# Patient Record
Sex: Female | Born: 1975
Health system: Southern US, Community
[De-identification: ages and names within clinical notes are randomized; demographics above are authoritative.]

## PROBLEM LIST (undated history)

## (undated) DIAGNOSIS — C801 Malignant (primary) neoplasm, unspecified: Secondary | ICD-10-CM

## (undated) DIAGNOSIS — D649 Anemia, unspecified: Secondary | ICD-10-CM

## (undated) DIAGNOSIS — J302 Other seasonal allergic rhinitis: Secondary | ICD-10-CM

## (undated) HISTORY — PX: NO PAST SURGERIES: SHX2092

## (undated) HISTORY — DX: Other seasonal allergic rhinitis: J30.2

---

## 2018-05-15 DIAGNOSIS — Z01419 Encounter for gynecological examination (general) (routine) without abnormal findings: Secondary | ICD-10-CM | POA: Diagnosis not present

## 2018-05-15 DIAGNOSIS — R350 Frequency of micturition: Secondary | ICD-10-CM | POA: Diagnosis not present

## 2018-05-15 DIAGNOSIS — N898 Other specified noninflammatory disorders of vagina: Secondary | ICD-10-CM | POA: Diagnosis not present

## 2018-06-10 ENCOUNTER — Ambulatory Visit: Payer: Self-pay | Admitting: Family Medicine

## 2018-07-11 NOTE — Progress Notes (Signed)
Kelly Moreno DOB: Apr 12, 1976 Encounter date: 07/13/2018  This is a 42 y.o. female who presents to establish care. Chief Complaint  Patient presents with  . New Patient (Initial Visit)    History of present illness:  Fiance broke off engagement Aug 21st. Since that day, things have just been getting worse. Had appointment but came on wrong day; so had to reschedule until now. Everything has been very difficult. She was completely surprised by the break up. Fiance and her have been together for 9 years and had all of wedding details planned out (they were about to send invites). Unfortunately fiance does not seem like he wants to make things work at this point. Has not been willing to go to counseling with her. She feels like her world has been turned upside down as she was ready to start a family and worries about timeline for having children.   Good family support, but feels that they don't understand all that she is going through.  She started seeing therapist and they recommended she come see about getting on medication. She is hesitant to take medication and does not wish to take something at this point that she would need every day, but admits that she feels like she might need something to help her through this. Has had benefit from therapy, but not sure she can maintain cost of sessions as she is limited through her insurance.   Not sleeping. Not had issues with anxiety/depression in past. Maybe sleeping 4-5 hours for now. Has to take something for sleep. Tried aleve PM for sleep; now taking melatonin. Still only getting 5 hours sleep with this. Not feeling rested when she gets up.   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 Has missed 7-8 days since all of this happened. 1.5 hour commute to work. Works M-F. Leaves at [employer] in morning and gets home after 6. Has days where she feels she shouldn't be out on road; just lack of sleep, or days unable to stop crying or get  control of symptoms. At least 2x/month would be ok with her. Difficult to make it through day without crying. Coworkers do not yet know about engagement being broken off and she feels she just can't answer questions at this point.   Past Medical History:  Diagnosis Date  . Seasonal allergies    History reviewed. No pertinent surgical history. No Known Allergies Current Meds  Medication Sig  . levocetirizine (XYZAL) 5 MG tablet Take 5 mg by mouth every evening.  . montelukast (SINGULAIR) 10 MG tablet Take 10 mg by mouth at bedtime.  . norgestimate-ethinyl estradiol (SPRINTEC 28) 0.25-35 MG-MCG tablet Take 1 tablet by mouth daily.   Social History   Tobacco Use  . Smoking status: Never Smoker  . Smokeless tobacco: Never Used  Substance Use Topics  . Alcohol use: Not on file   Family History  Problem Relation Age of Onset  . High blood pressure Mother   . High Cholesterol Mother   . High blood pressure Father   . High Cholesterol Father   . Osteoarthritis Maternal Grandmother   . Cancer Maternal Grandmother        blood; ended up turning to leukemia  . Alcohol abuse Maternal Grandfather   . Heart disease Maternal Grandfather   . Osteoarthritis Paternal Grandmother   . Kidney failure Paternal Grandmother   . Lung cancer Paternal Grandfather        smoker  Review of Systems  Constitutional: Negative for chills, fatigue and fever.  Respiratory: Negative for cough, chest tightness, shortness of breath and wheezing.   Cardiovascular: Negative for chest pain, palpitations and leg swelling.  Psychiatric/Behavioral: Positive for decreased concentration and sleep disturbance. Negative for agitation, self-injury and suicidal ideas. The patient is not nervous/anxious.     Objective:  BP 120/60 (BP Location: Left Arm, Patient Position: Sitting, Cuff Size: Normal)   Pulse 93   Temp 98.5 F (36.9 C) (Oral)   Ht 5\' 2"  (1.575 m)   Wt 184 lb 9.6 oz (83.7 kg)   SpO2 99%   BMI  33.76 kg/m   Weight: 184 lb 9.6 oz (83.7 kg)   BP Readings from Last 3 Encounters:  07/13/18 120/60   Wt Readings from Last 3 Encounters:  07/13/18 184 lb 9.6 oz (83.7 kg)    Physical Exam  Constitutional: She is oriented to person, place, and time. She appears well-developed and well-nourished. No distress.  Cardiovascular: Normal rate, regular rhythm and normal heart sounds. Exam reveals no friction rub.  No murmur heard. No lower extremity edema  Pulmonary/Chest: Effort normal and breath sounds normal. No respiratory distress. She has no wheezes. She has no rales.  Neurological: She is alert and oriented to person, place, and time.  Psychiatric: Her behavior is normal. Her affect is blunt. Her speech is not rapid and/or pressured. Cognition and memory are normal. She exhibits a depressed mood.  She is tearful during discussion. Just feels like whole life has been turned upside down.    Assessment/Plan:  1. Depression, major, single episode, moderate ([place]) This is very situational, but as we discussed sometimes medication can aid with management. We discussed SSRIs and wellbutrin but at this point she would prefer not to take something on a daily basis. I have encouraged her to let me know if she changes her mind, but in the meanwhile we will focus on sleep. I stressed exercise and re-establishing program, but she feels this will be too difficult with current work hours/commute.  2. Anxiety She wishes to avoid any benzodiazepines (on advice of friend who is counselor) but we discussed vistaril to help with sleep and (if not too sedating) to help with daily anxiety/stress. We discussed starting with low dose so that she may be able to use during work day. I do recommend continuing with therapy. It would of course be ideal if significant other would also participate.   3. Insomnia, unspecified type Trial of vistaril. Could also consider trazodone. She will update me on how sleep is  going in a week; if she feels this is not enough to help with mood management we could consider SSRI like zoloft that may help with all of above.   Return in about 1 month (around 08/12/2018). She agrees to let me know sooner if any worsening of mood.   Micheline Rough, MD

## 2018-07-13 ENCOUNTER — Ambulatory Visit: Payer: 59 | Admitting: Family Medicine

## 2018-07-13 ENCOUNTER — Encounter: Payer: Self-pay | Admitting: Family Medicine

## 2018-07-13 VITALS — BP 120/60 | HR 93 | Temp 98.5°F | Ht 62.0 in | Wt 184.6 lb

## 2018-07-13 DIAGNOSIS — G4700 Insomnia, unspecified: Secondary | ICD-10-CM | POA: Diagnosis not present

## 2018-07-13 DIAGNOSIS — Z23 Encounter for immunization: Secondary | ICD-10-CM | POA: Diagnosis not present

## 2018-07-13 DIAGNOSIS — F321 Major depressive disorder, single episode, moderate: Secondary | ICD-10-CM | POA: Diagnosis not present

## 2018-07-13 DIAGNOSIS — F419 Anxiety disorder, unspecified: Secondary | ICD-10-CM | POA: Diagnosis not present

## 2018-07-13 MED ORDER — HYDROXYZINE HCL 10 MG PO TABS: 10.0000 mg | ORAL_TABLET | Freq: Three times a day (TID) | ORAL | 1 refills | Status: DC | PRN

## 2018-07-13 NOTE — Patient Instructions (Addendum)
Try to get back to exercise program. I really feel that this will help your mood.   OK for trial of hydoxyzine for anxiety. Please see how you do with this in evening (ok to take two tablets to help with sleep) and if needed can try 1/2-1 tab during day for anxiety. OK to combine with melatonin.   Give me an update in 1 week with how sleeping is doing and how anxiety is doing. Please let me know sooner if any worsening of symptoms or not tolerating medication.

## 2018-07-27 ENCOUNTER — Telehealth: Payer: Self-pay

## 2018-07-27 NOTE — Telephone Encounter (Signed)
FMLA paper work has been completed for patient. Patient did not sign the forms before giving it to [place]. She will need to come sign the forms before we can fax it.   Forms placed up front for patient to come in and sign.

## 2018-07-27 NOTE — Telephone Encounter (Signed)
CRM created. 

## 2018-07-28 NOTE — Telephone Encounter (Signed)
Spoke with patient she has asked that the form be mailed to her

## 2018-11-04 DIAGNOSIS — L409 Psoriasis, unspecified: Secondary | ICD-10-CM | POA: Diagnosis not present

## 2018-11-04 DIAGNOSIS — Z1231 Encounter for screening mammogram for malignant neoplasm of breast: Secondary | ICD-10-CM | POA: Diagnosis not present

## 2018-12-01 ENCOUNTER — Telehealth: Payer: Self-pay

## 2018-12-01 NOTE — Telephone Encounter (Signed)
Last OV was 07/13/18. She is due for a follow up on her anxiety. Spoke with patient. She has declined a virtual visit.

## 2020-09-02 HISTORY — PX: ABLATION: SHX5711

## 2021-06-05 ENCOUNTER — Other Ambulatory Visit: Payer: Self-pay | Admitting: Urology

## 2021-06-05 DIAGNOSIS — D4100 Neoplasm of uncertain behavior of unspecified kidney: Secondary | ICD-10-CM

## 2021-06-19 ENCOUNTER — Other Ambulatory Visit: Payer: Self-pay

## 2021-06-19 ENCOUNTER — Ambulatory Visit
Admission: RE | Admit: 2021-06-19 | Discharge: 2021-06-19 | Disposition: A | Discharge status: Home / Self Care | Payer: PRIVATE HEALTH INSURANCE | Source: Ambulatory Visit | Attending: Urology | Admitting: Urology

## 2021-06-19 DIAGNOSIS — D4100 Neoplasm of uncertain behavior of unspecified kidney: Secondary | ICD-10-CM

## 2021-06-19 IMAGING — MR MR ABDOMEN WO/W CM
11 of 17 series · 28 of 48 positions shown · IV contrast (multihance)
Comparison: CT abdomen pelvis, [DATE]

CLINICAL DATA: Characterize right renal lesion

EXAM:
MRI ABDOMEN WITHOUT AND WITH CONTRAST
TECHNIQUE: Multiplanar multisequence MR imaging of the abdomen was performed
both before and after the administration of intravenous contrast.
CONTRAST:  17mL MULTIHANCE GADOBENATE DIMEGLUMINE 529 MG/ML IV SOLN

[Series 3: cor haste · coronal · 5.0mm · 0.68mm/px · 2 of 32 slices shown]
[im 1/32]
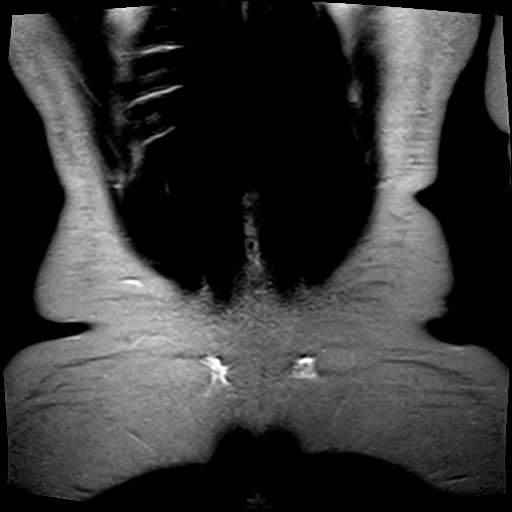
[im 32/32]
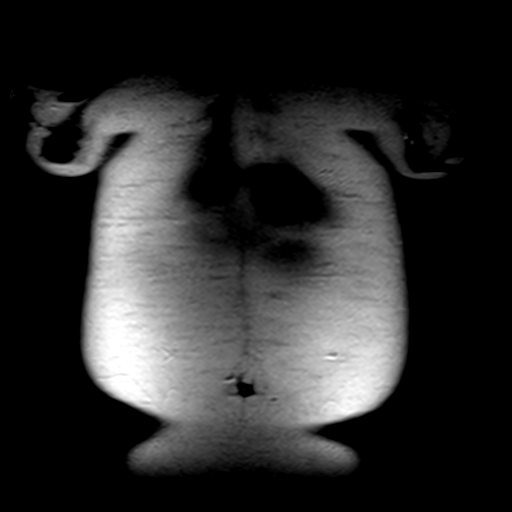

[Series 4: axial haste · axial · 6.0mm · 0.68mm/px · z∈[-101,+109]mm · 2 of 33 slices shown]
[im 1/33]
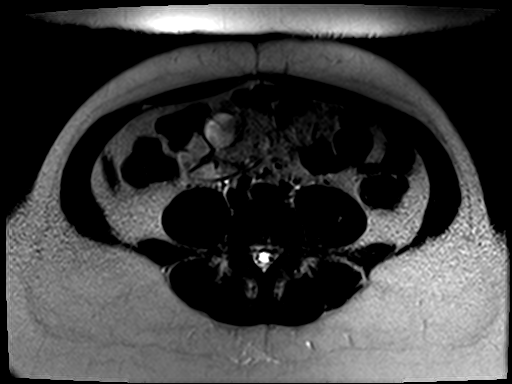
[im 33/33]
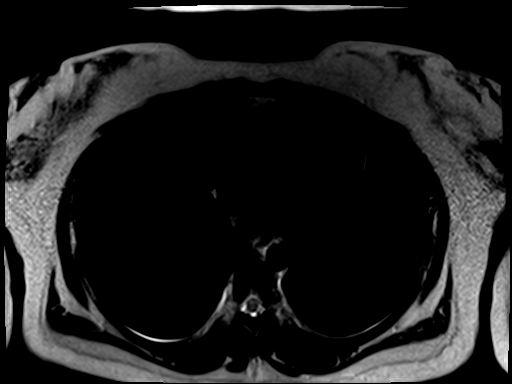

[Series 5: T1 · axial · 6.0mm · 0.68mm/px · z∈[-101,+109]mm · 4 of 66 slices shown]
[im 1/66]
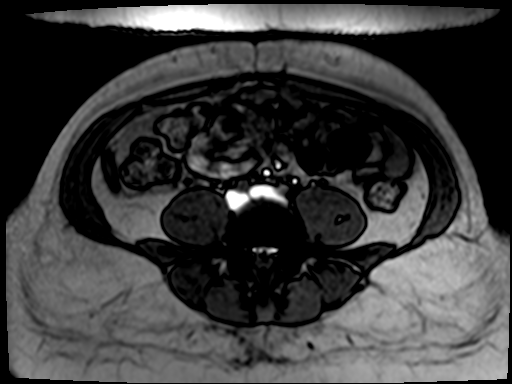
[im 22/66]
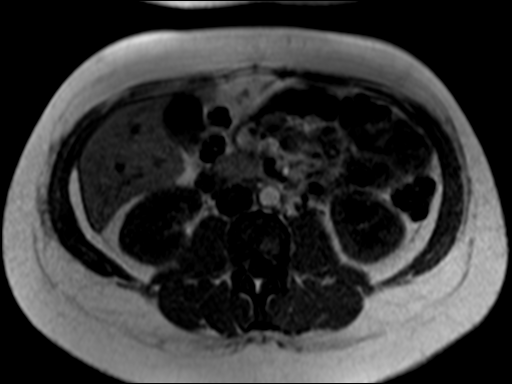
[im 44/66]
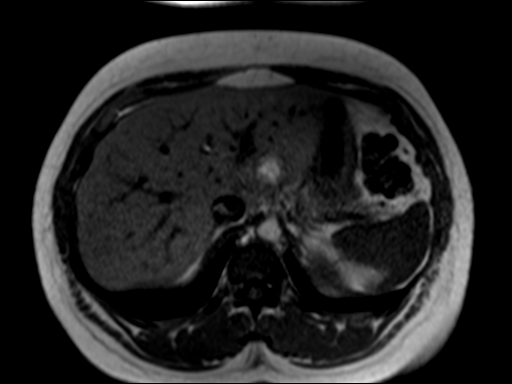
[im 66/66]
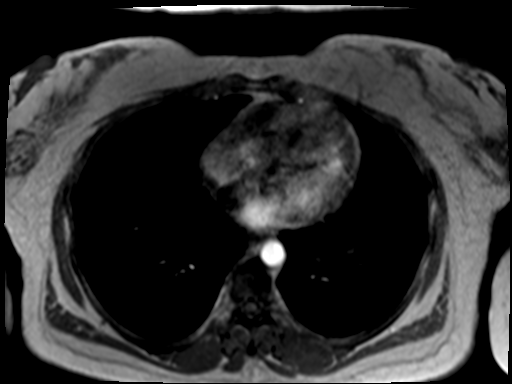

[Series 6: bSSFP · axial · 4.0mm · 0.68mm/px · z∈[-108,+116]mm · 3 of 57 slices shown]
[im 1/57]
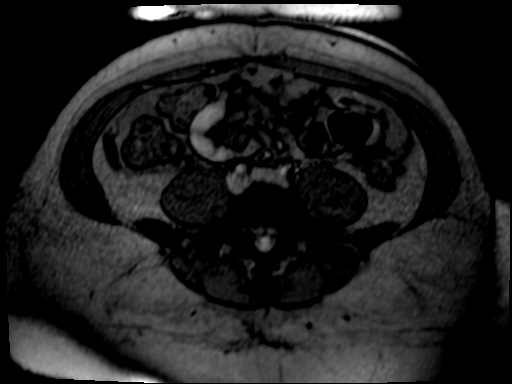
[im 29/57]
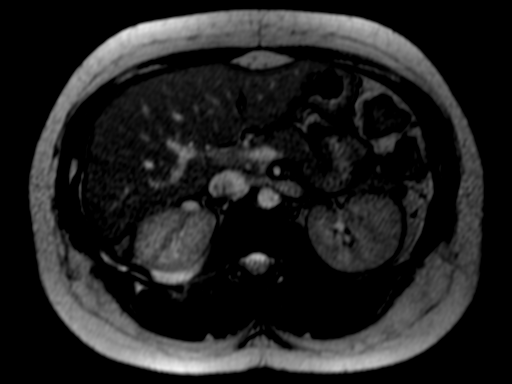
[im 57/57]
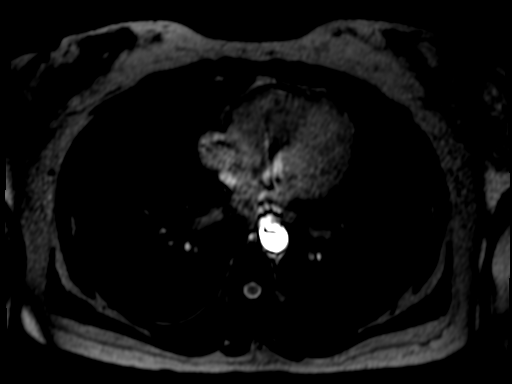

[Series 7: T2 fat-sat · axial · 6.0mm · 1.09mm/px · z∈[-74,+149]mm · 2 of 32 slices shown]
[im 1/32]
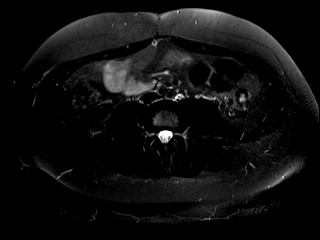
[im 32/32]
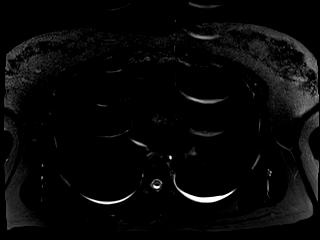

[Series 8: [id number]_trig · axial · 6.0mm · 1.82mm/px · z∈[-74,+149]mm · 4 of 96 slices shown]
[im 1/96]
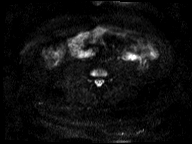
[im 32/96]
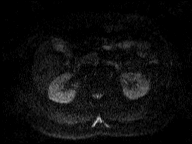
[im 64/96]
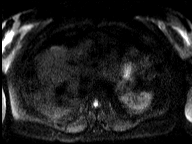
[im 96/96]
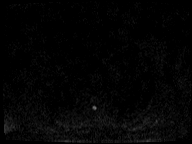

[Series 9: [id number]_trig_adc · axial · 6.0mm · 1.82mm/px · 1 of 32 slices shown]
[im 1/32]
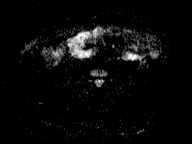

[Series 10: T1 dynamic · axial · non-contrast · 2.5mm · 0.74mm/px · z∈[-100,+96]mm · 3 of 80 slices shown]
[im 1/80]
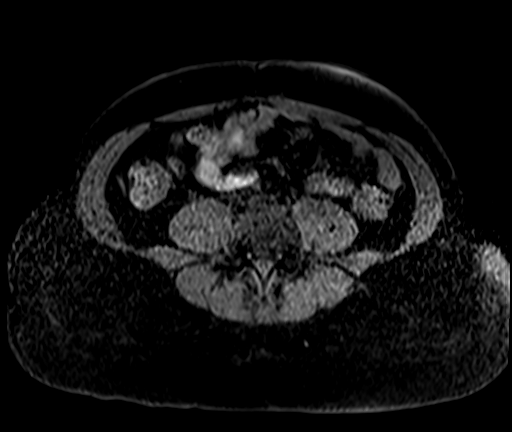
[im 40/80]
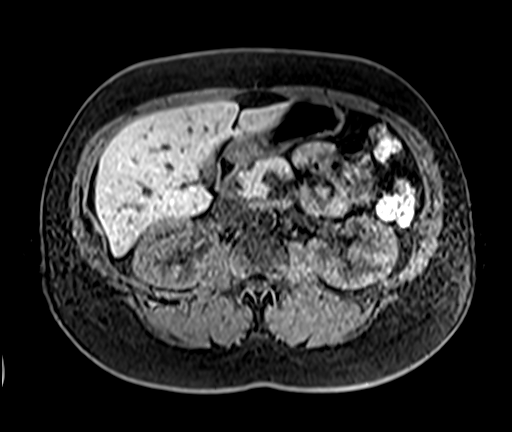
[im 80/80]
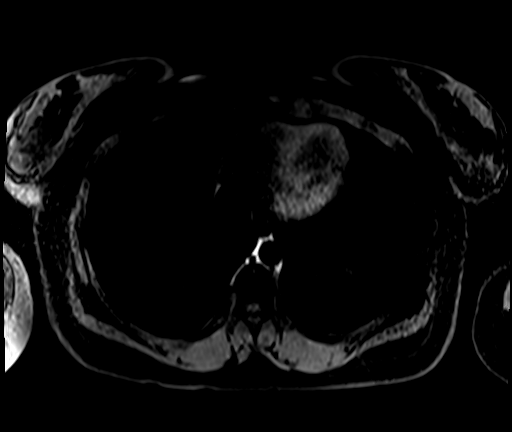

[Series 11: T1 dynamic post-contrast · axial · 2.5mm · 0.74mm/px · z∈[-100,+96]mm · 3 of 80 slices shown (1 of 3)]
[im 1/80]
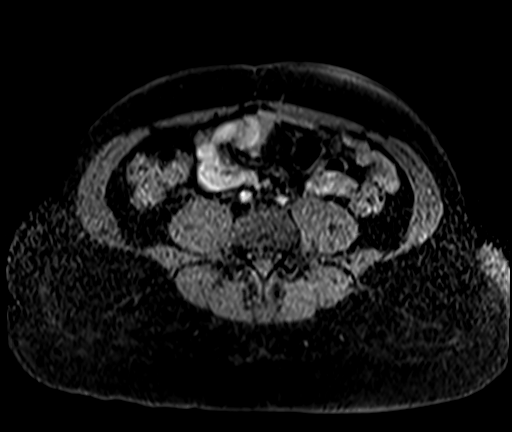
[im 40/80]
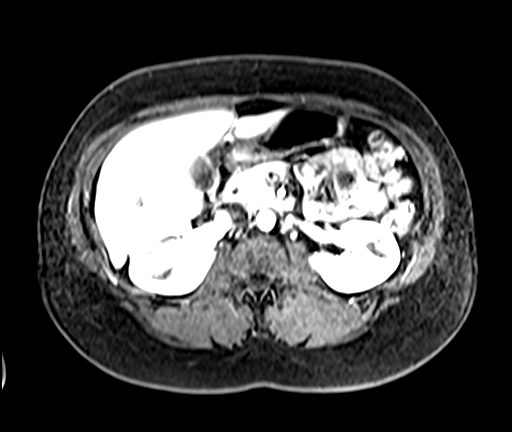
[im 80/80]
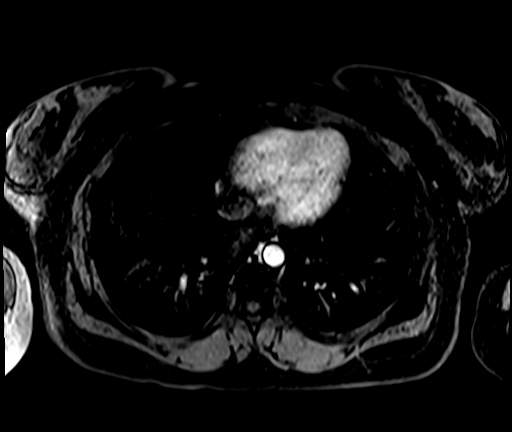

[Series 12: T1 dynamic post-contrast · axial · 2.5mm · 0.74mm/px · z∈[-100,+96]mm · 3 of 80 slices shown (2 of 3)]
[im 1/80]
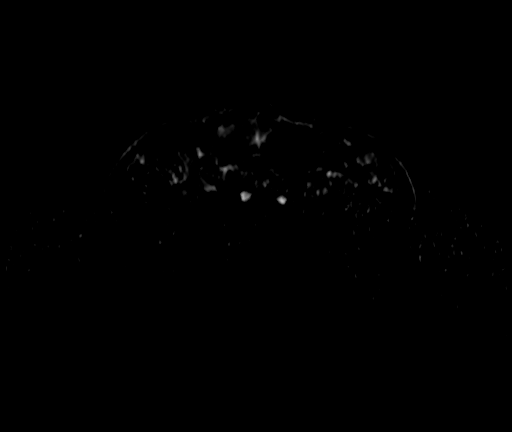
[im 40/80]
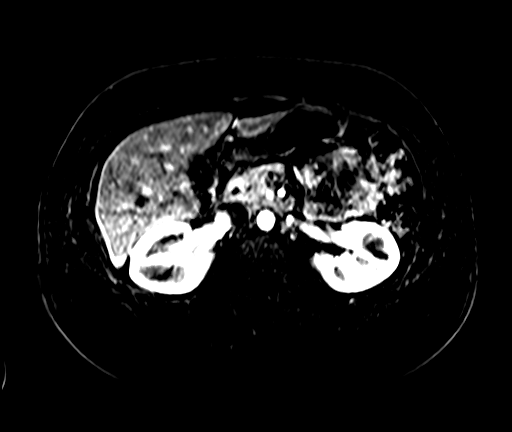
[im 80/80]
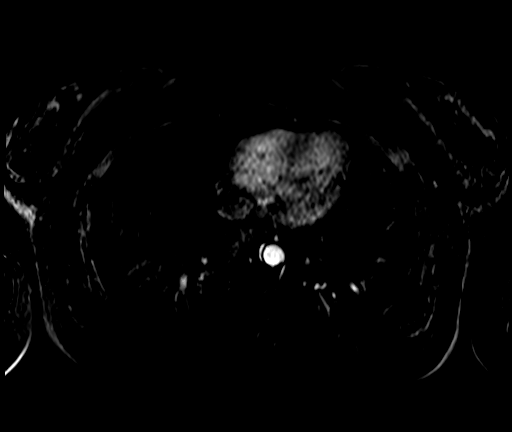

[Series 13: T1 dynamic post-contrast · axial · 2.5mm · 0.74mm/px · 1 of 80 slices shown (3 of 3)]
[im 1/80]
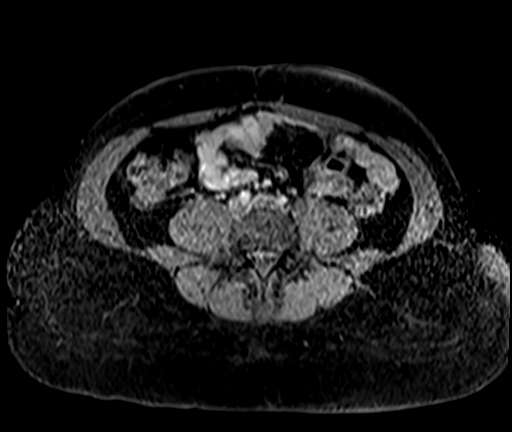

[28 of 48 positions shown; findings below may reference images not displayed]

FINDINGS: Lower chest: No acute findings.

Hepatobiliary: No mass or other parenchymal abnormality identified.
No gallstones. No biliary ductal dilatation.

Pancreas: No mass, inflammatory changes, or other parenchymal
abnormality identified. Pancreatic ductal dilatation.

Spleen:  Within normal limits in size and appearance.

Adrenals/Urinary Tract: Normal adrenals. Partially exophytic mass of
the lateral midportion of the right kidney measures 1.3 x 1.1 cm and
demonstrates contrast enhancement (series 13, image 40). Additional
simple cyst of the inferior pole of the right kidney. No evidence of
hydronephrosis.

Stomach/Bowel: Visualized portions within the abdomen are
unremarkable.

Vascular/Lymphatic: No pathologically enlarged lymph nodes
identified. No abdominal aortic aneurysm demonstrated.

Other:  None.

Musculoskeletal: No suspicious bone lesions identified.
IMPRESSION: 1. Partially exophytic mass of the lateral midportion of the right
kidney measures 1.3 x 1.1 cm and demonstrates contrast enhancement.
This is concerning for a small renal cell carcinoma.
2. No evidence of renal vein invasion, lymphadenopathy, or
metastatic disease in the abdomen.

## 2021-06-19 MED ORDER — GADOBENATE DIMEGLUMINE 529 MG/ML IV SOLN
17.0000 mL | Freq: Once | INTRAVENOUS | Status: AC | PRN
  Administered 2021-06-19: 17 mL via INTRAVENOUS

## 2021-06-22 ENCOUNTER — Ambulatory Visit (HOSPITAL_COMMUNITY)
Admission: RE | Admit: 2021-06-22 | Discharge: 2021-06-22 | Disposition: A | Discharge status: Home / Self Care | Payer: PRIVATE HEALTH INSURANCE | Source: Ambulatory Visit | Attending: Urology | Admitting: Urology

## 2021-06-22 ENCOUNTER — Other Ambulatory Visit (HOSPITAL_COMMUNITY): Payer: Self-pay | Admitting: Urology

## 2021-06-22 ENCOUNTER — Other Ambulatory Visit: Payer: Self-pay

## 2021-06-22 DIAGNOSIS — D49511 Neoplasm of unspecified behavior of right kidney: Secondary | ICD-10-CM | POA: Insufficient documentation

## 2021-06-22 DIAGNOSIS — I82220 Acute embolism and thrombosis of inferior vena cava: Secondary | ICD-10-CM | POA: Insufficient documentation

## 2021-06-22 IMAGING — DX DG CHEST 2V
2 series · 2 of 2 positions shown · non-contrast
Comparison: CT abdomen pelvis [DATE]

CLINICAL DATA: hx prev kidney cancer

EXAM:
CHEST - 2 VIEW

[chest pa]
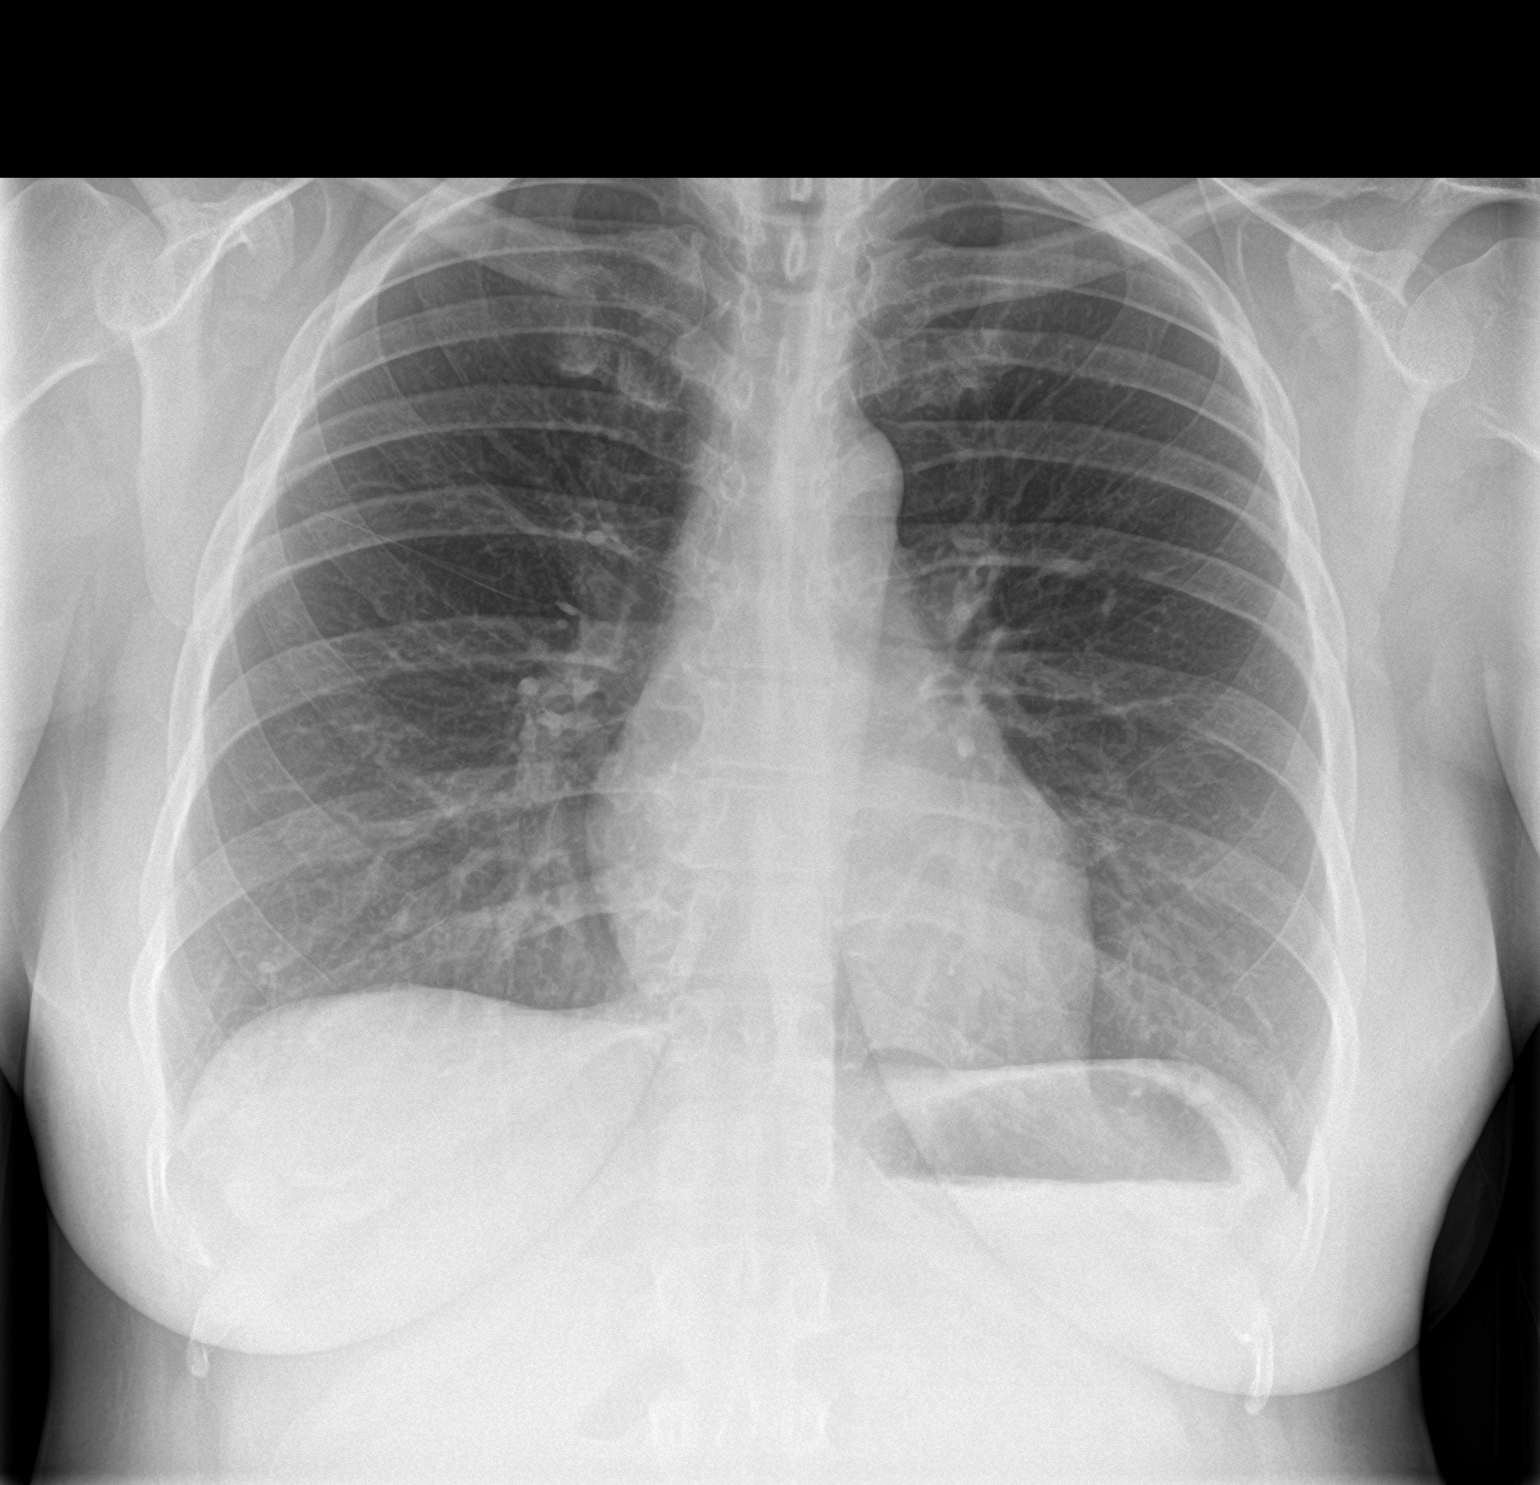

[chest lat]
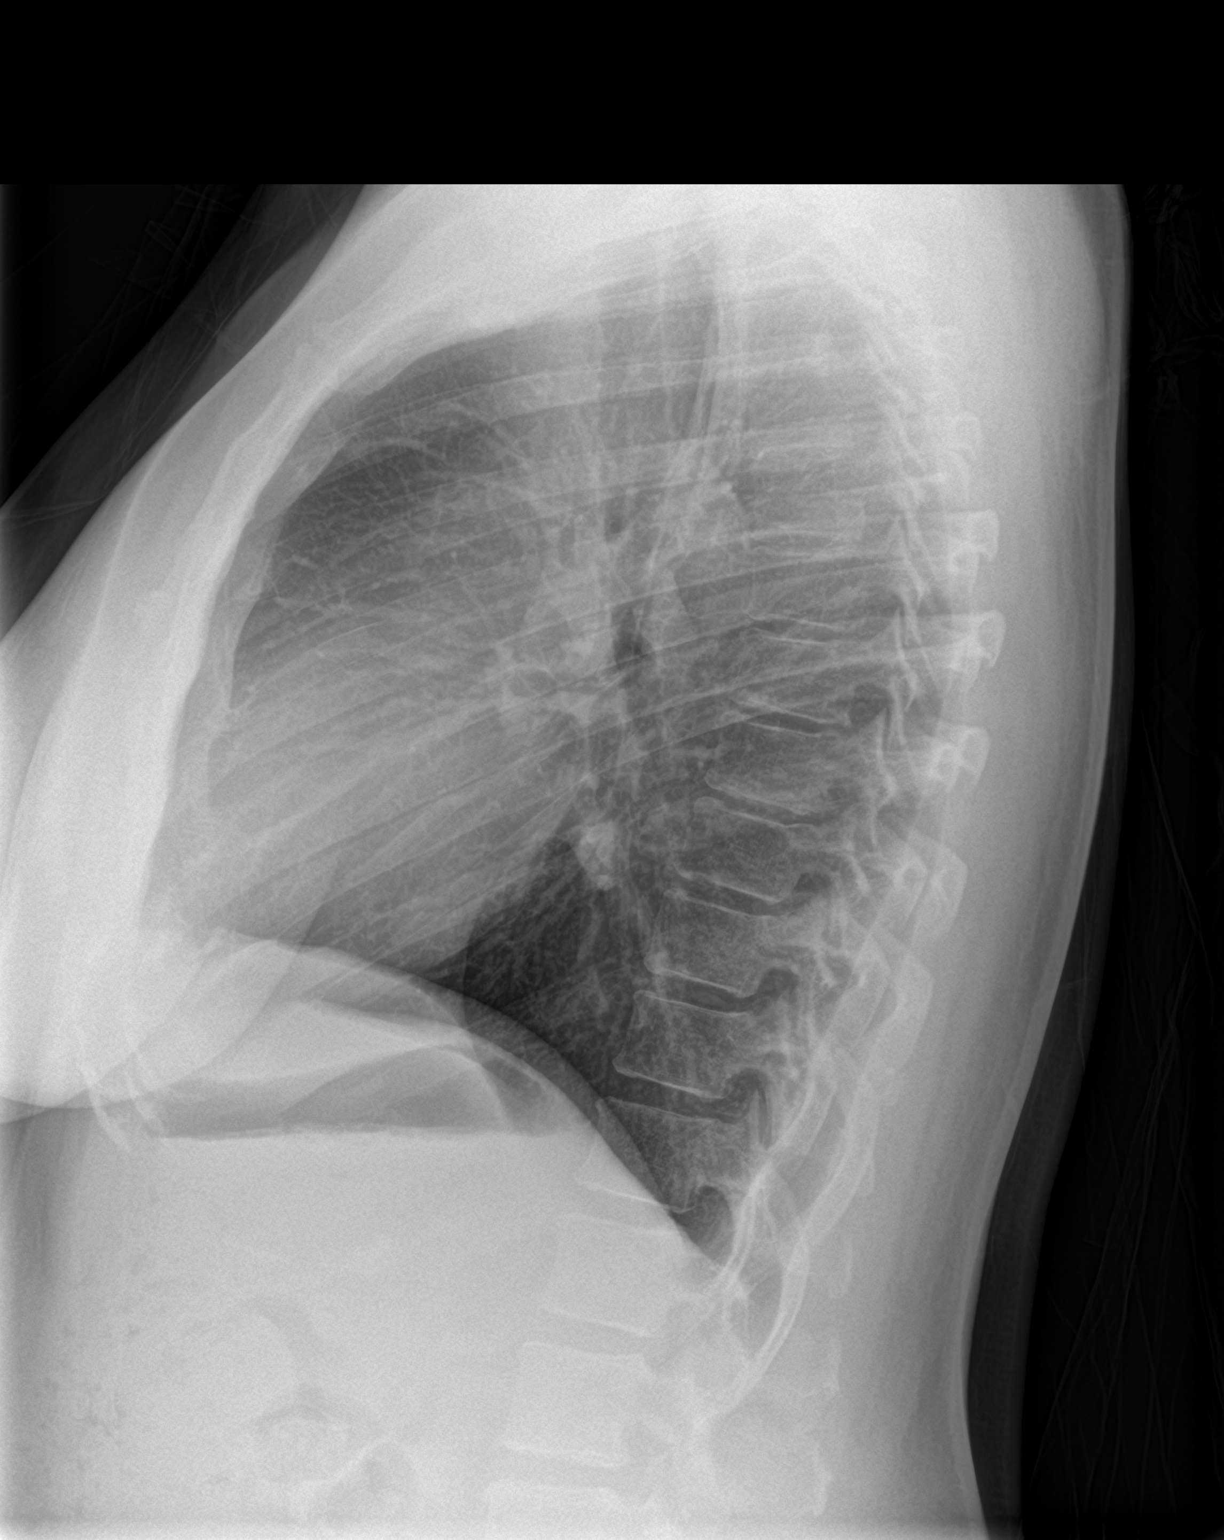

[2 of 2 positions shown; findings below may reference images not displayed]

FINDINGS: The cardiomediastinal silhouette is within normal limits. No pleural
effusion. No pneumothorax. No mass or consolidation. No acute
osseous abnormality.
IMPRESSION: No acute process identified in the chest.

Note that CT chest is more sensitive for the detection of early
metastatic disease to the lungs.

## 2021-07-09 ENCOUNTER — Other Ambulatory Visit: Payer: Self-pay | Admitting: Urology

## 2021-07-11 ENCOUNTER — Other Ambulatory Visit: Payer: Self-pay | Admitting: Urology

## 2021-07-11 DIAGNOSIS — N2889 Other specified disorders of kidney and ureter: Secondary | ICD-10-CM

## 2021-07-19 ENCOUNTER — Other Ambulatory Visit: Payer: Self-pay

## 2021-07-19 ENCOUNTER — Ambulatory Visit
Admission: RE | Admit: 2021-07-19 | Discharge: 2021-07-19 | Disposition: A | Discharge status: Home / Self Care | Payer: PRIVATE HEALTH INSURANCE | Source: Ambulatory Visit | Attending: Urology | Admitting: Urology

## 2021-07-19 ENCOUNTER — Encounter: Payer: Self-pay | Admitting: *Deleted

## 2021-07-19 DIAGNOSIS — N2889 Other specified disorders of kidney and ureter: Secondary | ICD-10-CM

## 2021-07-19 HISTORY — PX: IR RADIOLOGIST EVAL & MGMT: IMG5224

## 2021-07-19 NOTE — Consult Note (Signed)
Chief Complaint: Indeterminate right sided renal lesion  Referring Physician(s): Gay,Matthew R  History of Present Illness: Kelly Moreno is a 45 y.o. female with past medical history significant for seasonal allergies who is has been referred for evaluation of potential percutaneous management of worrisome right-sided renal lesion identified on abdominal CT performed 05/31/2021 for the evaluation of microscopic hematuria.  The lesion was further evaluated on abdominal MRI performed 06/19/2021 demonstrated an approximately 1.3 x 1.1 cm enhancing lesion involving the mid lateral aspect of the right kidney with imaging characteristics worrisome for a renal cell carcinoma.  Patient is asymptomatic in regards to this incidentally discovered right-sided renal lesion.  Specifically, she denies flank pain, objective hematuria, unintentional weight loss or weight gain.  The patient is interested in avoiding a surgical intervention if possible.    Past Medical History:  Diagnosis Date   Seasonal allergies     No past surgical history on file.  Allergies: Patient has no known allergies.  Medications: Prior to Admission medications   Medication Sig Start Date End Date Taking? Authorizing Provider  hydrOXYzine (ATARAX/VISTARIL) 10 MG tablet Take 1-2 tablets (10-20 mg total) by mouth 3 (three) times daily as needed for anxiety. 07/13/18   Koberlein, Steele Berg, MD  levocetirizine (XYZAL) 5 MG tablet Take 5 mg by mouth every evening.    [provider]  montelukast (SINGULAIR) 10 MG tablet Take 10 mg by mouth at bedtime.    [provider]  norgestimate-ethinyl estradiol ([place] 28) 0.25-35 MG-MCG tablet Take 1 tablet by mouth daily.    [provider]     Family History  Problem Relation Age of Onset   High blood pressure Mother    High Cholesterol Mother    High blood pressure Father    High Cholesterol Father    Osteoarthritis Maternal Grandmother     Cancer Maternal Grandmother        blood; ended up turning to leukemia   Alcohol abuse Maternal Grandfather    Heart disease Maternal Grandfather    Osteoarthritis Paternal Grandmother    Kidney failure Paternal Grandmother    Lung cancer Paternal Grandfather        smoker    Social History   Socioeconomic History   Marital status: Single    Spouse name: Not on file   Number of children: Not on file   Years of education: Not on file   Highest education level: Not on file  Occupational History   Not on file  Tobacco Use   Smoking status: Never   Smokeless tobacco: Never  Substance and Sexual Activity   Alcohol use: Not on file   Drug use: Not on file   Sexual activity: Not on file  Other Topics Concern   Not on file  Social History Narrative   Not on file   Social Determinants of Health   Financial Resource Strain: Not on file  Food Insecurity: Not on file  Transportation Needs: Not on file  Physical Activity: Not on file  Stress: Not on file  Social Connections: Not on file    ECOG Status: 0 - Asymptomatic  Review of Systems  Review of Systems: A 12 point ROS discussed and pertinent positives are indicated in the HPI above.  All other systems are negative.  Physical Exam No direct physical exam was performed (except for noted visual exam findings with Video Visits).   Vital Signs: There were no vitals taken for this visit.  Imaging:  Personal review of CT scan of the abdomen pelvis performed 05/31/2021 as well as abdominal MRI performed 06/19/2021 demonstrates an enhancing approximately 1.3 x 1.1 cm lesion arising from the anterior lateral interpolar aspect of the right kidney.  The lesion appears to abut the adjacent posterior medial aspect of the liver without definitive direct invasion.  The right renal vein appears widely patent without involvement.  The lesion does not appear to abut the right renal collecting system and thus is unlikely to serve as the  etiology of the patient's microscopic hematuria.  No regional lymphadenopathy.  DG Chest 2 View  Result Date: 06/22/2021 CLINICAL DATA:  hx prev kidney cancer EXAM: CHEST - 2 VIEW COMPARISON:  CT abdomen pelvis September 2022 FINDINGS: The cardiomediastinal silhouette is within normal limits. No pleural effusion. No pneumothorax. No mass or consolidation. No acute osseous abnormality. IMPRESSION: No acute process identified in the chest. Note that CT chest is more sensitive for the detection of early metastatic disease to the lungs. Electronically Signed   By: Albin Felling M.D.   On: 06/22/2021 16:12   MR ABDOMEN WWO CONTRAST  Result Date: 06/19/2021 CLINICAL DATA:  Characterize right renal lesion EXAM: MRI ABDOMEN WITHOUT AND WITH CONTRAST TECHNIQUE: Multiplanar multisequence MR imaging of the abdomen was performed both before and after the administration of intravenous contrast. CONTRAST:  11mL MULTIHANCE GADOBENATE DIMEGLUMINE 529 MG/ML IV SOLN COMPARISON:  CT abdomen pelvis, 05/31/2021 FINDINGS: Lower chest: No acute findings. Hepatobiliary: No mass or other parenchymal abnormality identified. No gallstones. No biliary ductal dilatation. Pancreas: No mass, inflammatory changes, or other parenchymal abnormality identified. Pancreatic ductal dilatation. Spleen:  Within normal limits in size and appearance. Adrenals/Urinary Tract: Normal adrenals. Partially exophytic mass of the lateral midportion of the right kidney measures 1.3 x 1.1 cm and demonstrates contrast enhancement (series 13, image 40). Additional simple cyst of the inferior pole of the right kidney. No evidence of hydronephrosis. Stomach/Bowel: Visualized portions within the abdomen are unremarkable. Vascular/Lymphatic: No pathologically enlarged lymph nodes identified. No abdominal aortic aneurysm demonstrated. Other:  None. Musculoskeletal: No suspicious bone lesions identified. IMPRESSION: 1. Partially exophytic mass of the lateral  midportion of the right kidney measures 1.3 x 1.1 cm and demonstrates contrast enhancement. This is concerning for a small renal cell carcinoma. 2. No evidence of renal vein invasion, lymphadenopathy, or metastatic disease in the abdomen. Electronically Signed   By: Delanna Ahmadi M.D.   On: 06/19/2021 16:24    Labs:  CBC: No results for input(s): WBC, HGB, HCT, PLT in the last 8760 hours.  COAGS: No results for input(s): INR, APTT in the last 8760 hours.  BMP: No results for input(s): NA, K, CL, CO2, GLUCOSE, BUN, CALCIUM, CREATININE, GFRNONAA, GFRAA in the last 8760 hours.  Invalid input(s): CMP  LIVER FUNCTION TESTS: No results for input(s): BILITOT, AST, ALT, ALKPHOS, PROT, ALBUMIN in the last 8760 hours.  TUMOR MARKERS: No results for input(s): AFPTM, CEA, CA199, CHROMGRNA in the last 8760 hours.  Assessment and Plan:  Kelly Moreno is a 45 y.o. female with past medical history significant for seasonal allergies who is has been referred for evaluation of potential percutaneous management of worrisome right-sided renal lesion identified on abdominal CT performed 05/31/2021 for the evaluation of microscopic hematuria.  The lesion was further evaluated on abdominal MRI performed 06/19/2021 demonstrated an approximately 1.3 x 1.1 cm enhancing lesion involving the mid lateral aspect of the right kidney with imaging characteristics worrisome for a renal cell carcinoma.  Patient  is asymptomatic in regards to this incidentally discovered right-sided renal lesion.    Personal review of CT scan of the abdomen and pelvis performed 05/31/2021 as well as abdominal MRI performed 06/19/2021 demonstrates an enhancing approximately 1.3 x 1.1 cm lesion arising from the anterior lateral interpolar aspect of the right kidney.  The lesion appears to abut the adjacent posterior medial aspect of the liver without definitive direct invasion.  The right renal vein appears widely patent without involvement.   The lesion does not appear to abut the right renal collecting system and thus is unlikely to serve as the etiology of the patient's microscopic hematuria.  No regional lymphadenopathy.  I explained to the patient that given the imaging appearance of the lesion, it is considered a renal cell carcinoma and does not warrant biopsy as any result besides a kidney cancer would be deemed a sampling error.  Given the size (less than 3 cm) and location, exophytically located about the anterolateral aspect of the right kidney, I feel this solitary lesion is amenable to percutaneous cryoablation.  As such, prolonged conversations were held with the patient regarding potential management solutions including the following:  1.  Conservative management - I explained that in the absence of prior examinations, the growth rate of this lesion cannot be assessed however typically these instantly discovered renal lesions demonstrate an indolent behavior and 1 potential management solution could entail surveillance imaging.  If this option were pursued I would offer the patient a follow-up abdominal MRI in 3 months (mid January 2023).  2.  Definitive surgical resection, likely with partial nephrectomy - I explained that the gold standard treatment for renal lesions is surgical resection as a surgical margin/surgical cure is obtained at the time of the procedure.  3.  Cryoablation - I explained that while the cryoablation is performed with curative intent, as surgical margins are not obtained, we must ensure an imaging cure with postprocedural surveillance imaging obtained every 3 months the first year, every 6 months of following year with a final scan 3 years after the procedure. Risks and benefits of image guided renal cryoablation was discussed with the patient including, but not limited to, failure to treat entire lesion, bleeding, infection, damage to adjacent structures, hematuria, urine leak, decrease in renal  function or post procedural neuropathy. I explained that given the small size of the lesion, biopsy would likely not be obtained at the time of the cryoablation.  I explained that the cryoablation is performed at [facility] with general anesthesia and that the patient would be admitted overnight for continued observation and PCA usage.  I explained that the recovery from the procedure is typically in the range of 7 to 10 days at which time she could experience right-sided flank pain, post ablation syndrome (such as a low-grade fever and anorexia) as well as potentially macroscopic hematuria.  Following the above prolonged and detailed conversation, the patient wishes to pursue cryoablation and avoid a surgical intervention  As patient would like to have the procedure performed soon as possible, ideally during the weeks of either November 28th or December 5th, the patient is willing to have one of my other capable interventional radiology partners perform the procedure if this will entail the procedure is scheduled in a more expeditious manner.  Plan: - Schedule image guided right-sided renal cryoablation at [facility] at the earliest available ablation slot,  ideally during the weeks of either November 28th or December 5th.  Again, the patient is  going to have the procedure performed by one of my capable interventional radiology partners if this will result in a more expeditious procedure.  The patient knows to call the interventional radiology clinic with any interval questions or concerns  Thank you for this interesting consult.  I greatly enjoyed meeting Kelly Moreno and look forward to participating in their care.  A copy of this report was sent to the requesting provider on this date.  Electronically Signed: Sandi Mariscal 07/19/2021, 7:55 AM   I spent a total of 40 Minutes in remote  clinical consultation, greater than 50% of which was counseling/coordinating care for  indeterminate right-sided renal lesion.    Visit type: Audio only (telephone). Audio (no video) only due to patient's lack of internet/smartphone capability. Alternative for in-person consultation at [facility], [address], [place], [place]. This visit type was conducted due to national recommendations for restrictions regarding the COVID-19 Pandemic (e.g. social distancing).  This format is felt to be most appropriate for this patient at this time.  All issues noted in this document were discussed and addressed.

## 2021-07-30 ENCOUNTER — Other Ambulatory Visit (HOSPITAL_COMMUNITY): Payer: Self-pay | Admitting: Interventional Radiology

## 2021-07-30 DIAGNOSIS — N289 Disorder of kidney and ureter, unspecified: Secondary | ICD-10-CM

## 2021-07-30 NOTE — Progress Notes (Signed)
Orders requested via epic.

## 2021-08-07 NOTE — Patient Instructions (Signed)
DUE TO COVID-19 ONLY ONE VISITOR IS ALLOWED TO COME WITH YOU AND STAY IN THE WAITING ROOM ONLY DURING PRE OP AND PROCEDURE DAY OF SURGERY IF YOU ARE GOING HOME AFTER SURGERY. IF YOU ARE SPENDING THE NIGHT 2 PEOPLE MAY VISIT WITH YOU IN YOUR PRIVATE ROOM AFTER SURGERY UNTIL VISITING  HOURS ARE OVER AT 800 PM AND 1  VISITOR  MAY  SPEND THE NIGHT.   YOU NEED TO HAVE A COVID 19 TEST ON___12/12____ THIS TEST MUST BE DONE BEFORE SURGERY,  COVID TESTING SITE  IS LOCATED AT [place], [place]. REMAIN IN YOUR CAR THIS IS A DRIVE UP TEST. AFTER YOUR COVID TEST PLEASE WEAR A MASK OUT IN PUBLIC AND SOCIAL DISTANCE AND [place] YOUR HANDS FREQUENTLY, ALSO ASK ALL YOUR CLOSE CONTACT PERSONS TO WEAR A MASK AND SOCIAL DISTANCE AND [place] THEIR HANDS FREQUENTLY ALSO.               [name]     Your procedure is scheduled on: 08/15/21   Report to [facility] Main  Entrance   Report to admitting at   10:00 AM     Call this number if you have problems the morning of surgery [phone]    Remember: Do not eat food or drink :After Midnight the night before your surgery,          BRUSH YOUR TEETH MORNING OF SURGERY AND RINSE YOUR MOUTH OUT, NO CHEWING GUM CANDY OR MINTS.     Take these medicines the morning of surgery with A SIP OF WATER: Contraceptive                                You may not have any metal on your body including hair pins and              piercings  Do not wear jewelry, make-up, lotions, powders or perfumes, deodorant             Do not wear nail polish on your fingernails.  Do not shave  48 hours prior to surgery.                Do not bring valuables to the hospital. [place].  Contacts, dentures or bridgework may not be worn into surgery.  Leave suitcase in the car. After surgery it may be brought to your room.                  Please read over the following fact sheets you were  given: _____________________________________________________________________             [facility] - Preparing for Surgery Before surgery, you can play an important role.  Because skin is not sterile, your skin needs to be as free of germs as possible.  You can reduce the number of germs on your skin by washing with CHG (chlorahexidine gluconate) soap before surgery.  CHG is an antiseptic cleaner which kills germs and bonds with the skin to continue killing germs even after washing. Please DO NOT use if you have an allergy to CHG or antibacterial soaps.  If your skin becomes reddened/irritated stop using the CHG and inform your nurse when you arrive at Short Stay. Do not shave (including legs and underarms) for at least  48 hours prior to the first CHG shower.   Please follow these instructions carefully:  1.  Shower with CHG Soap the night before surgery and the  morning of Surgery.  2.  If you choose to wash your hair, wash your hair first as usual with your  normal  shampoo.  3.  After you shampoo, rinse your hair and body thoroughly to remove the  shampoo.                            4.  Use CHG as you would any other liquid soap.  You can apply chg directly  to the skin and wash                       Gently with a scrungie or clean washcloth.  5.  Apply the CHG Soap to your body ONLY FROM THE NECK DOWN.   Do not use on face/ open                           Wound or open sores. Avoid contact with eyes, ears mouth and genitals (private parts).                       Wash face,  Genitals (private parts) with your normal soap.             6.  Wash thoroughly, paying special attention to the area where your surgery  will be performed.  7.  Thoroughly rinse your body with warm water from the neck down.  8.  DO NOT shower/wash with your normal soap after using and rinsing off  the CHG Soap.                9.  Pat yourself dry with a clean towel.            10.  Wear clean pajamas.            11.   Place clean sheets on your bed the night of your first shower and do not  sleep with pets. Day of Surgery : Do not apply any lotions/deodorants the morning of surgery.  Please wear clean clothes to the hospital/surgery center.  FAILURE TO FOLLOW THESE INSTRUCTIONS MAY RESULT IN THE CANCELLATION OF YOUR SURGERY PATIENT SIGNATURE_________________________________  NURSE SIGNATURE__________________________________  ________________________________________________________________________

## 2021-08-09 ENCOUNTER — Other Ambulatory Visit: Payer: Self-pay

## 2021-08-09 ENCOUNTER — Encounter (HOSPITAL_COMMUNITY)
Admission: RE | Admit: 2021-08-09 | Discharge: 2021-08-09 | Disposition: A | Discharge status: Home / Self Care | Payer: PRIVATE HEALTH INSURANCE | Source: Ambulatory Visit | Attending: Interventional Radiology | Admitting: Interventional Radiology

## 2021-08-09 ENCOUNTER — Telehealth: Payer: Self-pay | Admitting: Family Medicine

## 2021-08-09 ENCOUNTER — Encounter (HOSPITAL_COMMUNITY): Payer: Self-pay

## 2021-08-09 VITALS — BP 108/69 | HR 72 | Temp 98.2°F | Resp 18 | Ht 62.0 in | Wt 187.6 lb

## 2021-08-09 DIAGNOSIS — Z01812 Encounter for preprocedural laboratory examination: Secondary | ICD-10-CM | POA: Insufficient documentation

## 2021-08-09 DIAGNOSIS — N289 Disorder of kidney and ureter, unspecified: Secondary | ICD-10-CM

## 2021-08-09 HISTORY — DX: Anemia, unspecified: D64.9

## 2021-08-09 LAB — CBC
HCT: 40.4 % (ref 36.0–46.0)
Hemoglobin: 13.1 g/dL (ref 12.0–15.0)
MCH: 28.6 pg (ref 26.0–34.0)
MCHC: 32.4 g/dL (ref 30.0–36.0)
MCV: 88.2 fL (ref 80.0–100.0)
Platelets: 294 10*3/uL (ref 150–400)
RBC: 4.58 MIL/uL (ref 3.87–5.11)
RDW: 14.1 % (ref 11.5–15.5)
WBC: 7.3 10*3/uL (ref 4.0–10.5)
nRBC: 0 % (ref 0.0–0.2)

## 2021-08-09 LAB — BASIC METABOLIC PANEL
Anion gap: 6 (ref 5–15)
BUN: 18 mg/dL (ref 6–20)
CO2: 28 mmol/L (ref 22–32)
Calcium: 9 mg/dL (ref 8.9–10.3)
Chloride: 102 mmol/L (ref 98–111)
Creatinine, Ser: 0.87 mg/dL (ref 0.44–1.00)
GFR, Estimated: >60 mL/min (ref >60)
Glucose, Bld: 86 mg/dL (ref 70–99)
Potassium: 3.9 mmol/L (ref 3.5–5.1)
Sodium: 136 mmol/L (ref 135–145)

## 2021-08-09 NOTE — Telephone Encounter (Signed)
Contact number is for surgery center is [phone].

## 2021-08-09 NOTE — Progress Notes (Signed)
COVID test- NA  PCP - Dr. Benjie Karvonen Cardiologist - none  Chest x-ray - no EKG - no Stress Test - no ECHO - no Cardiac Cath - no Pacemaker/ICD device last checked:NA  Sleep Study - no CPAP -   Fasting Blood Sugar - NA Checks Blood Sugar _____ times a day  Blood Thinner Instructions:NA Aspirin Instructions: Last Dose:  Anesthesia review: yes Pt is for IR  Patient denies shortness of breath, fever, cough and chest pain at PAT appointment  No SOB with any activities Patient verbalized understanding of instructions that were given to them at the PAT appointment. Patient was also instructed that they will need to review over the PAT instructions again at home before surgery. yes

## 2021-08-09 NOTE — Telephone Encounter (Signed)
Spoke with Crystal at the number below and requested she inform [name] our office is not able to fax over records without a signed release from the patient.

## 2021-08-09 NOTE — Telephone Encounter (Signed)
[name] with [facility] called to request for patient documents to be faxed over. Patient only had one appointment with [facility] in 2019.  Fax number is [phone].  Please advise.

## 2021-08-13 ENCOUNTER — Other Ambulatory Visit (HOSPITAL_COMMUNITY): Payer: Self-pay | Admitting: Interventional Radiology

## 2021-08-13 LAB — SARS CORONAVIRUS 2 (TAT 6-24 HRS): SARS Coronavirus 2: NEGATIVE

## 2021-08-14 ENCOUNTER — Other Ambulatory Visit: Payer: Self-pay | Admitting: Radiology

## 2021-08-14 DIAGNOSIS — N289 Disorder of kidney and ureter, unspecified: Secondary | ICD-10-CM

## 2021-08-14 NOTE — H&P (Signed)
Chief Complaint: Patient was seen in consultation today for right renal lesion  Referring Physician(s): Rexene Alberts  Supervising Physician: Sandi Mariscal  Patient Status: [facility] - Out-pt  History of Present Illness: Kelly Moreno is a 45 y.o. female with a past medical history significant for anemia and right renal lesion who presents today for a right renal lesion cryoablation. [name] was seen in consultation by Dr. Pascal Lux on 07/19/21 - please refer to this note for full details. Briefly, [name] was incidentally found to have a right renal lesion of undetermined etiology on CT 05/31/21. She underwent MRI on 10/18 for further evaluation and this noted characteristics concerning for renal cell carcinoma. She was referred to IR for discussion of management options and after thorough evaluation of her options she has decided to proceed with cryoablation for which she presents today.  Past Medical History:  Diagnosis Date   Anemia    Seasonal allergies     Past Surgical History:  Procedure Laterality Date   IR RADIOLOGIST EVAL & MGMT  07/19/2021   NO PAST SURGERIES      Allergies: Patient has no known allergies.  Medications: Prior to Admission medications   Medication Sig Start Date End Date Taking? Authorizing Provider  Acetaminophen-Caff-Pyrilamine (MIDOL COMPLETE) 500-60-15 MG TABS Take 1 tablet by mouth 3 (three) times daily as needed (menstrual pain.).    [provider]  calcipotriene-betamethasone (TACLONEX) ointment Apply 1 application topically once a week. Applied to affected area(s) of skin 06/17/21   [provider]  Clobetasol Propionate 0.05 % shampoo Apply 1 application topically once a week. 06/17/21   [provider]  Fluocinolone Acetonide Body 0.01 % OIL Apply 1 application topically once a week. Applied to scalp 06/17/21   [provider]  fluticasone (FLONASE) 50 MCG/ACT nasal spray Place 1 spray into both nostrils  every evening.    [provider]  ibuprofen (ADVIL) 200 MG tablet Take 200 mg by mouth every 8 (eight) hours as needed (menstrual pain.).    [provider]  JUNEL 1/20 1-20 MG-MCG tablet Take 1 tablet by mouth at bedtime. 06/17/21   [provider]  levocetirizine (XYZAL) 5 MG tablet Take 5 mg by mouth every evening.    [provider]  Multiple Vitamin (MULTIVITAMIN WITH MINERALS) TABS tablet Take 1 tablet by mouth every evening.    [provider]  Multiple Vitamin (MULTIVITAMIN) LIQD Take 15 mLs by mouth 2 (two) times a week. NutraBurst Liquid Multivitamin    [provider]  norgestimate-ethinyl estradiol (ORTHO-CYCLEN) 0.25-35 MG-MCG tablet Take 1 tablet by mouth at bedtime.    [provider]  Pseudoephedrine-Ibuprofen (ADVIL COLD/SINUS) 30-200 MG TABS Take 1 tablet by mouth daily as needed (cold symptoms).    [provider]  valACYclovir (VALTREX) 500 MG tablet Take 500 mg by mouth 2 (two) times daily as needed (fever blisters/cold sores). 06/17/21   [provider]     Family History  Problem Relation Age of Onset   High blood pressure Mother    High Cholesterol Mother    High blood pressure Father    High Cholesterol Father    Osteoarthritis Maternal Grandmother    Cancer Maternal Grandmother        blood; ended up turning to leukemia   Alcohol abuse Maternal Grandfather    Heart disease Maternal Grandfather    Osteoarthritis Paternal Grandmother    Kidney failure Paternal Grandmother    Lung cancer Paternal Grandfather  smoker    Social History   Socioeconomic History   Marital status: Single    Spouse name: Not on file   Number of children: Not on file   Years of education: Not on file   Highest education level: Not on file  Occupational History   Not on file  Tobacco Use   Smoking status: Never   Smokeless tobacco: Never  Substance and Sexual Activity   Alcohol use: Yes     Alcohol/week: 4.0 standard drinks    Types: 4 Standard drinks or equivalent per week   Drug use: Never   Sexual activity: Not on file  Other Topics Concern   Not on file  Social History Narrative   Not on file   Social Determinants of Health   Financial Resource Strain: Not on file  Food Insecurity: Not on file  Transportation Needs: Not on file  Physical Activity: Not on file  Stress: Not on file  Social Connections: Not on file     Review of Systems: A 12 point ROS discussed and pertinent positives are indicated in the HPI above.  All other systems are negative.  Review of Systems  Constitutional:  Negative for chills and fever.  Respiratory:  Negative for cough and shortness of breath.   Cardiovascular:  Negative for chest pain.  Gastrointestinal:  Negative for abdominal pain, nausea and vomiting.  Musculoskeletal:  Negative for back pain.  Neurological:  Negative for headaches.   Vital Signs: LMP 08/08/2021   Physical Exam Vitals reviewed.  Constitutional:      General: She is not in acute distress. HENT:     Head: Normocephalic.     Mouth/Throat:     Mouth: Mucous membranes are moist.     Pharynx: Oropharynx is clear. No oropharyngeal exudate or posterior oropharyngeal erythema.  Cardiovascular:     Rate and Rhythm: Normal rate and regular rhythm.  Pulmonary:     Effort: Pulmonary effort is normal.     Breath sounds: Normal breath sounds.  Abdominal:     General: There is no distension.     Palpations: Abdomen is soft.     Tenderness: There is no abdominal tenderness.  Skin:    General: Skin is warm and dry.  Neurological:     Mental Status: She is alert and oriented to person, place, and time.  Psychiatric:        Mood and Affect: Mood normal.        Behavior: Behavior normal.        Thought Content: Thought content normal.        Judgment: Judgment normal.         Imaging: IR Radiologist Eval & Mgmt  Result Date: 07/19/2021 Please refer to  notes tab for details about interventional procedure. (Op Note)   Labs:  CBC: Recent Labs    08/09/21 0814 08/15/21 1045  WBC 7.3 8.8  HGB 13.1 13.4  HCT 40.4 42.3  PLT 294 250    COAGS: Recent Labs    08/15/21 1045  INR 1.0    BMP: Recent Labs    08/09/21 0814 08/15/21 1045  NA 136 134*  K 3.9 4.4  CL 102 103  CO2 28 21*  GLUCOSE 86 77  BUN 18 17  CALCIUM 9.0 9.2  CREATININE 0.87 0.78  GFRNONAA >60 >60    LIVER FUNCTION TESTS: No results for input(s): BILITOT, AST, ALT, ALKPHOS, PROT, ALBUMIN in the last 8760 hours.  TUMOR MARKERS: No results  for input(s): AFPTM, CEA, CA199, CHROMGRNA in the last 8760 hours.  Assessment and Plan:  45 y/o F with newly discovered right renal lesion concerning for possible renal cell carcinoma who presents today for percutaneous cryoablation with general anesthesia. She is planned for overnight observation and pain control.  Risks and benefits discussed with the patient including, but not limited to failure to treat entire lesion, bleeding, infection, damage to adjacent structures, decrease in renal function or post procedural neuropathy.  All of the patient's questions were answered, patient is agreeable to proceed.  Consent signed and in chart.  Thank you for this interesting consult.  I greatly enjoyed meeting CYDNEE FUQUAY and look forward to participating in their care.  A copy of this report was sent to the requesting provider on this date.  Electronically Signed: Joaquim Nam, PA-C 08/14/2021, 3:59 PM   I spent a total of  25 Minutes in face to face in clinical consultation, greater than 50% of which was counseling/coordinating care for right renal lesion cryoablation.

## 2021-08-14 NOTE — H&P (Deleted)
  The note originally documented on this encounter has been moved the the encounter in which it belongs.  

## 2021-08-15 ENCOUNTER — Ambulatory Visit (HOSPITAL_COMMUNITY): Payer: Managed Care, Other (non HMO) | Admitting: Certified Registered Nurse Anesthetist

## 2021-08-15 ENCOUNTER — Ambulatory Visit (HOSPITAL_COMMUNITY)
Admission: RE | Admit: 2021-08-15 | Discharge: 2021-08-15 | Disposition: A | Discharge status: Home / Self Care | Payer: Managed Care, Other (non HMO) | Source: Ambulatory Visit | Attending: Interventional Radiology | Admitting: Interventional Radiology

## 2021-08-15 ENCOUNTER — Observation Stay (HOSPITAL_COMMUNITY)
Admission: RE | Admit: 2021-08-15 | Discharge: 2021-08-16 | Disposition: A | Discharge status: Home / Self Care | Payer: Managed Care, Other (non HMO) | Attending: Interventional Radiology | Admitting: Interventional Radiology

## 2021-08-15 ENCOUNTER — Encounter (HOSPITAL_COMMUNITY): Payer: Self-pay | Admitting: Interventional Radiology

## 2021-08-15 ENCOUNTER — Encounter (HOSPITAL_COMMUNITY)
Admission: RE | Disposition: A | Discharge status: Home / Self Care | Payer: Self-pay | Source: Home / Self Care | Attending: Interventional Radiology

## 2021-08-15 ENCOUNTER — Other Ambulatory Visit: Payer: Self-pay

## 2021-08-15 DIAGNOSIS — D649 Anemia, unspecified: Secondary | ICD-10-CM | POA: Insufficient documentation

## 2021-08-15 DIAGNOSIS — Z23 Encounter for immunization: Secondary | ICD-10-CM | POA: Diagnosis not present

## 2021-08-15 DIAGNOSIS — Z79899 Other long term (current) drug therapy: Secondary | ICD-10-CM | POA: Diagnosis not present

## 2021-08-15 DIAGNOSIS — C641 Malignant neoplasm of right kidney, except renal pelvis: Secondary | ICD-10-CM | POA: Diagnosis present

## 2021-08-15 DIAGNOSIS — N289 Disorder of kidney and ureter, unspecified: Secondary | ICD-10-CM

## 2021-08-15 HISTORY — PX: RADIOLOGY WITH ANESTHESIA: SHX6223

## 2021-08-15 LAB — CBC WITH DIFFERENTIAL/PLATELET
Abs Immature Granulocytes: 0.03 10*3/uL (ref 0.00–0.07)
Basophils Absolute: 0 10*3/uL (ref 0.0–0.1)
Basophils Relative: 0 %
Eosinophils Absolute: 0.1 10*3/uL (ref 0.0–0.5)
Eosinophils Relative: 1 %
HCT: 42.3 % (ref 36.0–46.0)
Hemoglobin: 13.4 g/dL (ref 12.0–15.0)
Immature Granulocytes: 0 %
Lymphocytes Relative: 32 %
Lymphs Abs: 2.8 10*3/uL (ref 0.7–4.0)
MCH: 28.3 pg (ref 26.0–34.0)
MCHC: 31.7 g/dL (ref 30.0–36.0)
MCV: 89.2 fL (ref 80.0–100.0)
Monocytes Absolute: 0.6 10*3/uL (ref 0.1–1.0)
Monocytes Relative: 7 %
Neutro Abs: 5.2 10*3/uL (ref 1.7–7.7)
Neutrophils Relative %: 60 %
Platelets: 250 10*3/uL (ref 150–400)
RBC: 4.74 MIL/uL (ref 3.87–5.11)
RDW: 13.8 % (ref 11.5–15.5)
WBC: 8.8 10*3/uL (ref 4.0–10.5)
nRBC: 0 % (ref 0.0–0.2)

## 2021-08-15 LAB — ABO/RH: ABO/RH(D): B POS

## 2021-08-15 LAB — PREGNANCY, URINE: Preg Test, Ur: NEGATIVE

## 2021-08-15 LAB — BASIC METABOLIC PANEL
Anion gap: 10 (ref 5–15)
BUN: 17 mg/dL (ref 6–20)
CO2: 21 mmol/L — ABNORMAL LOW (ref 22–32)
Calcium: 9.2 mg/dL (ref 8.9–10.3)
Chloride: 103 mmol/L (ref 98–111)
Creatinine, Ser: 0.78 mg/dL (ref 0.44–1.00)
GFR, Estimated: >60 mL/min (ref >60)
Glucose, Bld: 77 mg/dL (ref 70–99)
Potassium: 4.4 mmol/L (ref 3.5–5.1)
Sodium: 134 mmol/L — ABNORMAL LOW (ref 135–145)

## 2021-08-15 LAB — TYPE AND SCREEN
ABO/RH(D): B POS
Antibody Screen: NEGATIVE

## 2021-08-15 LAB — PROTIME-INR
INR: 1 (ref 0.8–1.2)
Prothrombin Time: 12.8 seconds (ref 11.4–15.2)

## 2021-08-15 IMAGING — US CT GUIDANCE TISSUE ABLATION
1 series · 3 of 3 positions shown · non-contrast
Comparison: none

CLINICAL DATA: Right-sided renal lesion. Patient presents today for
image guided right renal cryoablation.

[Series 1: ct guidance tissue ablation · 3 of 3 slices shown]
[im 1/3]
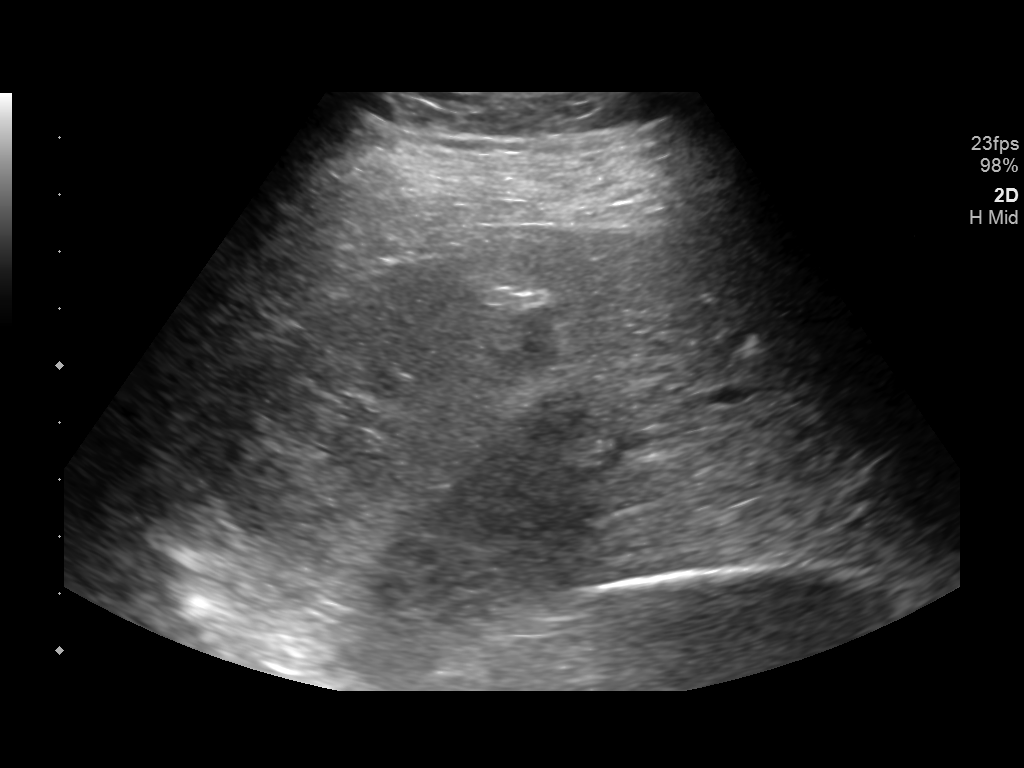
[im 2/3]
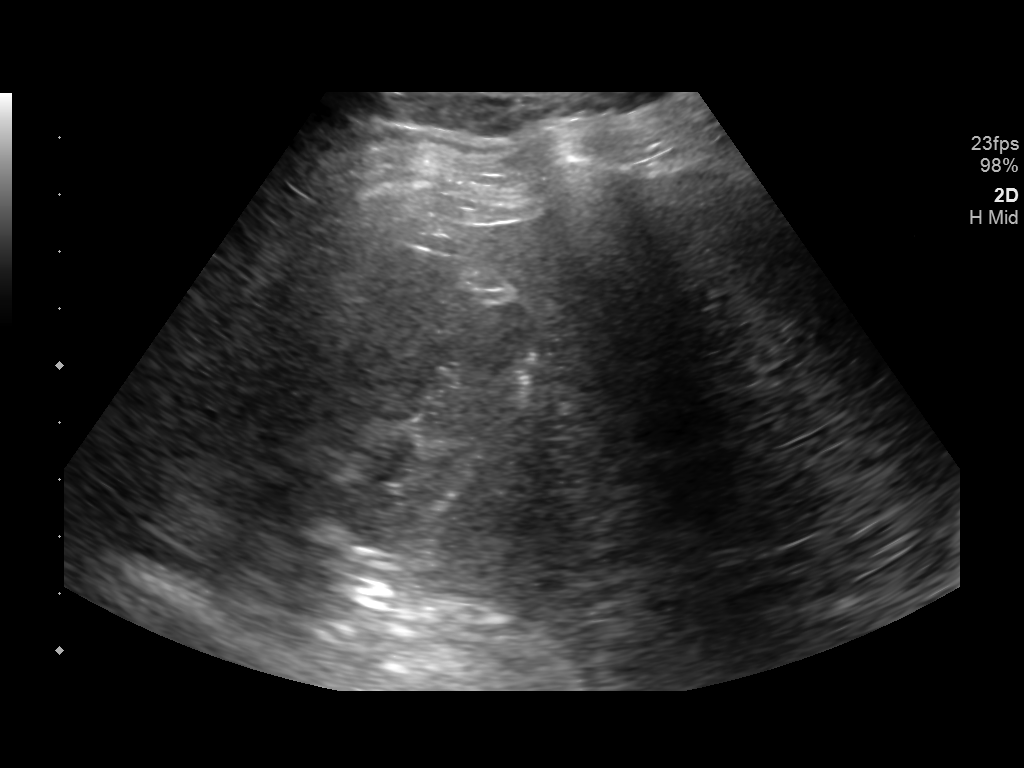
[im 3/3]
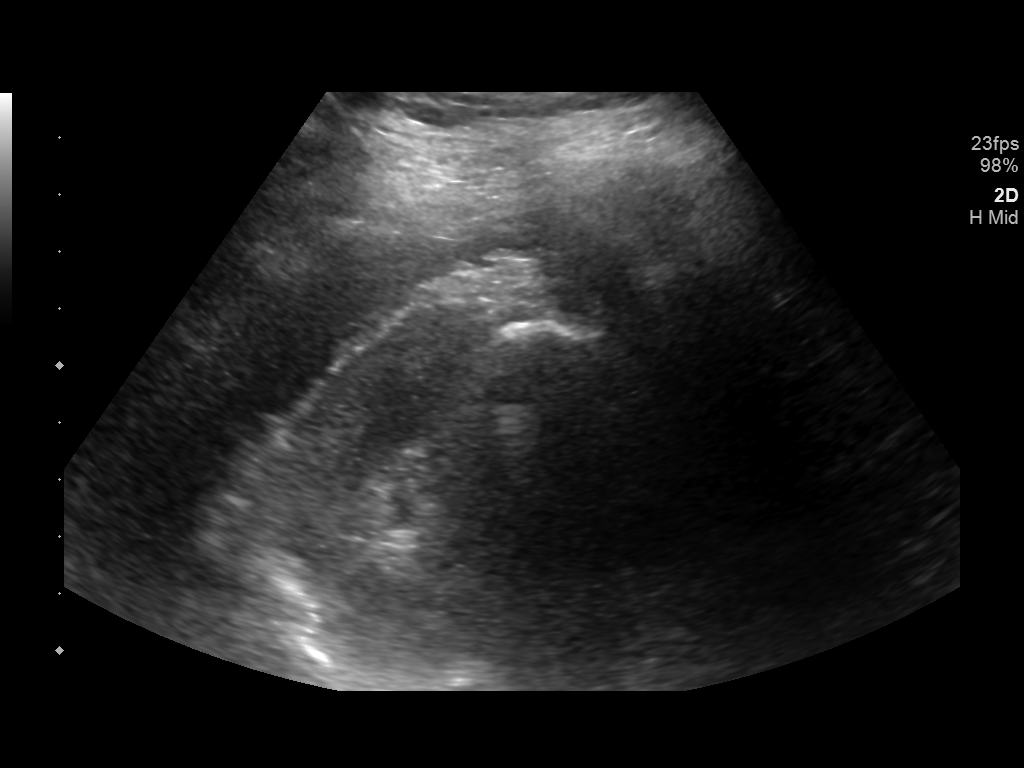

[3 of 3 positions shown; findings below may reference images not displayed]

EXAM:
CT AND ULTRASOUND-GUIDED RIGHT RENAL CRYOABLATION

ANESTHESIA/SEDATION:
General

MEDICATIONS:
Ancef 2 g IV; the antibiotic was administered in an appropriate time
interval prior to needle puncture of the skin.

CONTRAST:  None

PROCEDURE:
The procedure, risks, benefits, and alternatives were explained to
the patient. Questions regarding the procedure were encouraged and
answered. The patient understands and consents to the procedure.

The patient was placed under general anesthesia. Initial un-enhanced
CT was performed in a prone position to localize the indeterminate
right-sided renal lesion. The CT gantry table position was marked
and the lesion was identified sonographically.

The skin overlying the right flank was prepped and draped in usual
sterile fashion. Appropriate trajectory was planned with the use of
a 22 gauge spinal needle.

Next, under intermittent CT and ultrasound guidance, a 13.5 gauge
TASER Agility cryoablation probe was advanced into the slightly
hypoattenuating right-sided renal lesion. Appropriate positioning
was confirmed with CT imaging.

Note, given the small size of the lesion, a biopsy was not obtained.

Next, a 22 gauge spinal needle was advanced into the right pararenal
space and approximately 150 cc saline and dilute contrast was
administered the purposes of hydrodissection.

Final positioning was confirmed with CT imaging and the cryoablation
was performed.

Initial 10 minute cycle of cryoablation was performed, followed by a
8 minute thaw cycle, followed by a second 10 minute cycle of
cryoablation. During ablation, periodic CT imaging was performed to
monitor ice ball formation and morphology. After active thaw, the
cryoablation probe was removed and dressings were applied.

COMPLICATIONS:
None immediate.
FINDINGS: Preprocedural imaging demonstrates unchanged size and appearance of
the slightly hyperattenuating partially exophytic at least 1.6 x
cm lesion arising from the lateral interpolar aspect the right
kidney (image 18, series 3).

Utilizing a combination of CT and ultrasound guidance, the
cryoablation probe was appropriately positioned through the
partially exophytic right renal lesion.

Next, the cryoablation was then performed with imaging demonstrating
the ice ball encompassing the entirety of the expected location of
the partially exophytic right renal lesion (series 18).
IMPRESSION: Technically successful CT and ultrasound guided percutaneous core
cryoablation of worrisome right-sided renal lesion.

PLAN:
- The patient will be admitted overnight for continued observation
and PCA usage.

- Telemedicine consultation will performed in approximately 4 weeks

- Initial postprocedural MRI will be performed in 3 months (mid
[DATE]).

## 2021-08-15 IMAGING — CT CT GUIDANCE TISSUE ABLATION
3 of 11 series · 9 of 39 positions shown, 11 images · non-contrast
Comparison: none

CLINICAL DATA: Right-sided renal lesion. Patient presents today for
image guided right renal cryoablation.

[Series 2: i-spiral 2.0 bf37 · axial · 0.98mm/px · z∈[+39,+127]mm · 5 of 74 slices shown, 7 images (1 of 3)]
[im 15/74  mediastinal]
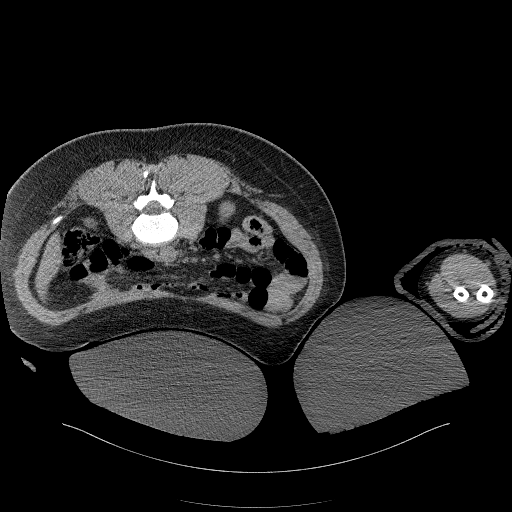
[im 15/74  lung]
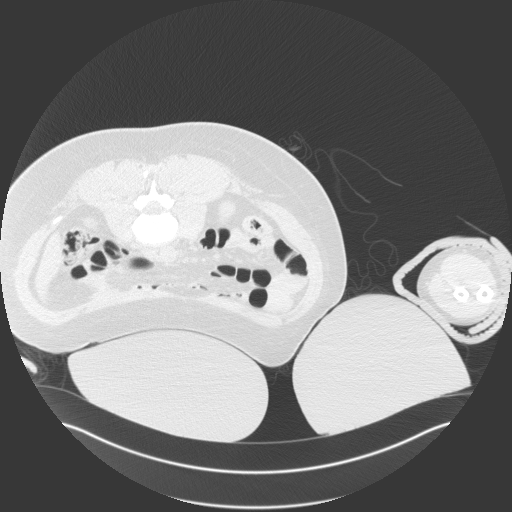
[im 30/74  lung]
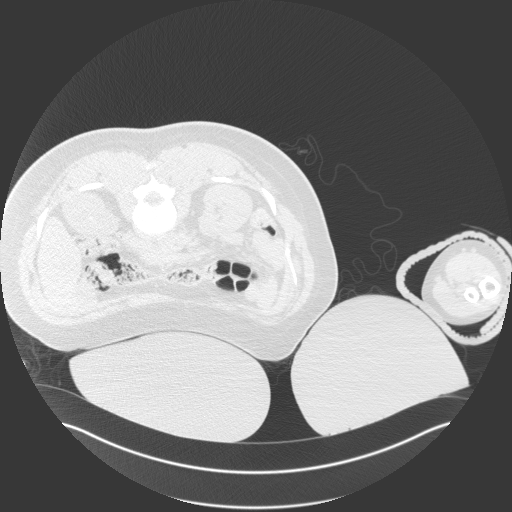
[im 33/74  lung]
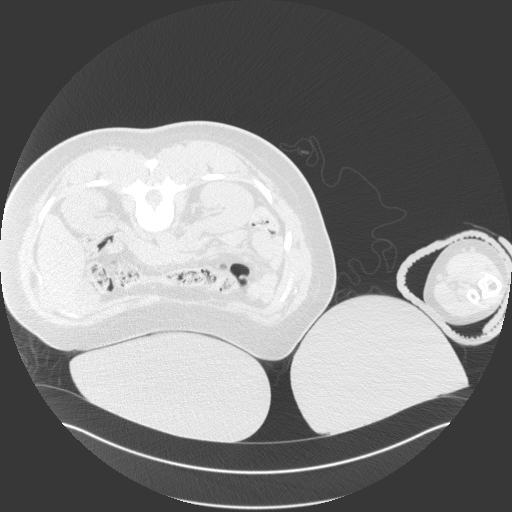
[im 44/74  lung]
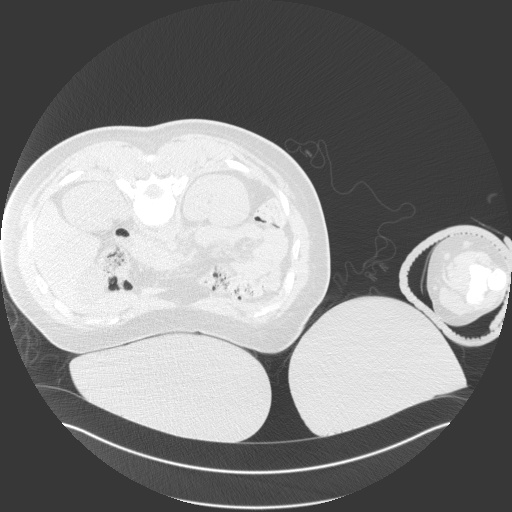
[im 59/74  mediastinal]
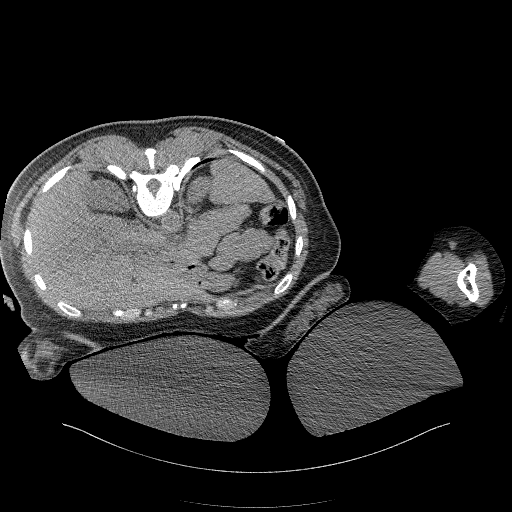
[im 59/74  lung]
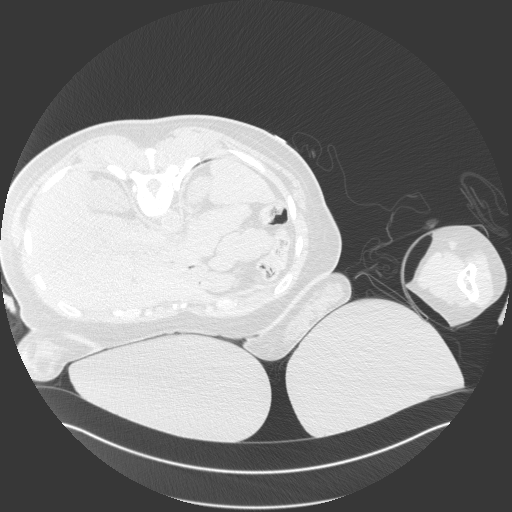

[Series 3: i-spiral 2.0 bf37 · axial · 0.62mm/px · z∈[+74,+102]mm · 2 of 35 slices shown (2 of 3)]
[im 4/35  lung]
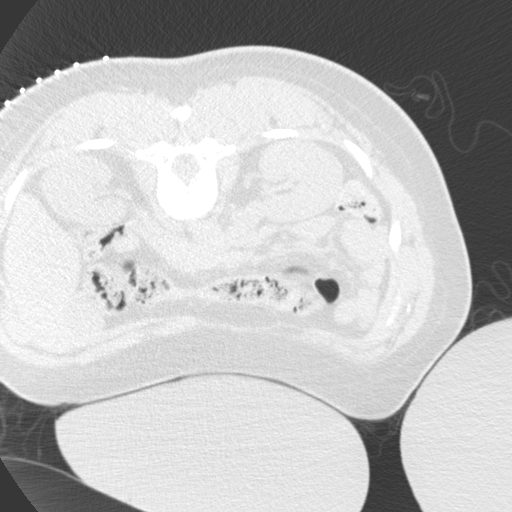
[im 18/35  lung]
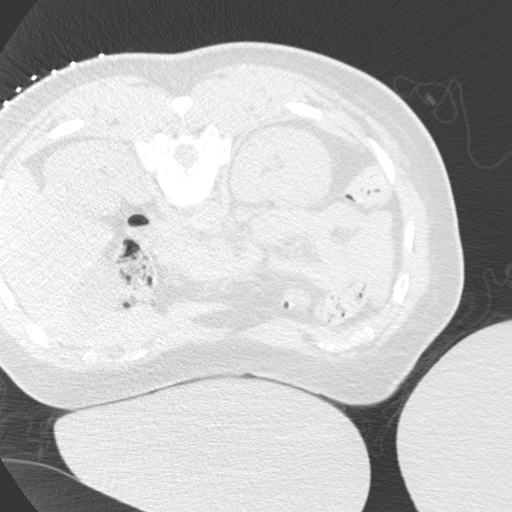

[Series 4: i-spiral 2.0 bf37 · axial · 0.98mm/px · z∈[+74,+102]mm · 2 of 35 slices shown (3 of 3)]
[im 4/35  lung]
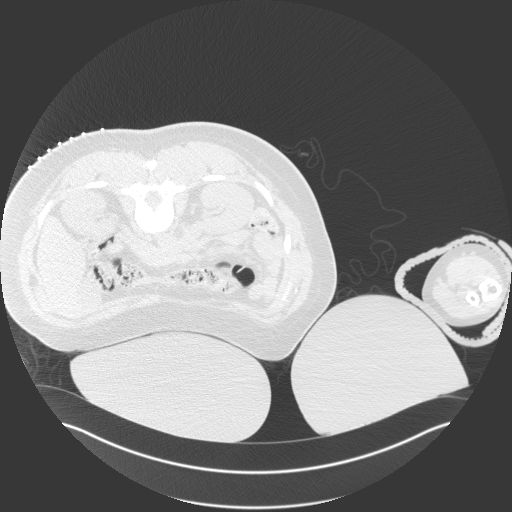
[im 18/35  lung]
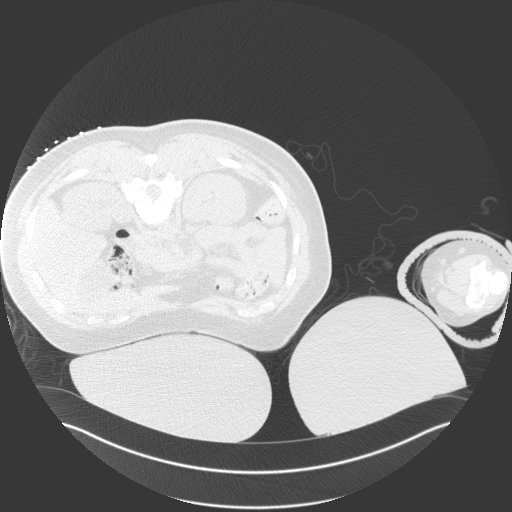

[9 of 39 positions shown; findings below may reference images not displayed]

EXAM:
CT AND ULTRASOUND-GUIDED RIGHT RENAL CRYOABLATION

ANESTHESIA/SEDATION:
General

MEDICATIONS:
Ancef 2 g IV; the antibiotic was administered in an appropriate time
interval prior to needle puncture of the skin.

CONTRAST:  None

PROCEDURE:
The procedure, risks, benefits, and alternatives were explained to
the patient. Questions regarding the procedure were encouraged and
answered. The patient understands and consents to the procedure.

The patient was placed under general anesthesia. Initial un-enhanced
CT was performed in a prone position to localize the indeterminate
right-sided renal lesion. The CT gantry table position was marked
and the lesion was identified sonographically.

The skin overlying the right flank was prepped and draped in usual
sterile fashion. Appropriate trajectory was planned with the use of
a 22 gauge spinal needle.

Next, under intermittent CT and ultrasound guidance, a 13.5 gauge
TASER Agility cryoablation probe was advanced into the slightly
hypoattenuating right-sided renal lesion. Appropriate positioning
was confirmed with CT imaging.

Note, given the small size of the lesion, a biopsy was not obtained.

Next, a 22 gauge spinal needle was advanced into the right pararenal
space and approximately 150 cc saline and dilute contrast was
administered the purposes of hydrodissection.

Final positioning was confirmed with CT imaging and the cryoablation
was performed.

Initial 10 minute cycle of cryoablation was performed, followed by a
8 minute thaw cycle, followed by a second 10 minute cycle of
cryoablation. During ablation, periodic CT imaging was performed to
monitor ice ball formation and morphology. After active thaw, the
cryoablation probe was removed and dressings were applied.

COMPLICATIONS:
None immediate.
FINDINGS: Preprocedural imaging demonstrates unchanged size and appearance of
the slightly hyperattenuating partially exophytic at least 1.6 x
cm lesion arising from the lateral interpolar aspect the right
kidney (image 18, series 3).

Utilizing a combination of CT and ultrasound guidance, the
cryoablation probe was appropriately positioned through the
partially exophytic right renal lesion.

Next, the cryoablation was then performed with imaging demonstrating
the ice ball encompassing the entirety of the expected location of
the partially exophytic right renal lesion (series 18).
IMPRESSION: Technically successful CT and ultrasound guided percutaneous core
cryoablation of worrisome right-sided renal lesion.

PLAN:
- The patient will be admitted overnight for continued observation
and PCA usage.

- Telemedicine consultation will performed in approximately 4 weeks

- Initial postprocedural MRI will be performed in 3 months (mid
[DATE]).

## 2021-08-15 SURGERY — IR WITH ANESTHESIA
Anesthesia: General | Laterality: Right

## 2021-08-15 MED ORDER — ACETAMINOPHEN 500 MG PO TABS: 1000.0000 mg | ORAL_TABLET | Freq: Once | ORAL | Status: DC

## 2021-08-15 MED ORDER — ORAL CARE MOUTH RINSE: 15.0000 mL | Freq: Once | OROMUCOSAL | Status: AC

## 2021-08-15 MED ORDER — MIDAZOLAM HCL 2 MG/2ML IJ SOLN
INTRAMUSCULAR | Status: AC
  Filled 2021-08-15: qty 2

## 2021-08-15 MED ORDER — LEVOCETIRIZINE DIHYDROCHLORIDE 5 MG PO TABS: 5.0000 mg | ORAL_TABLET | Freq: Every evening | ORAL | Status: DC

## 2021-08-15 MED ORDER — ONDANSETRON HCL 4 MG/2ML IJ SOLN
INTRAMUSCULAR | Status: DC | PRN
  Administered 2021-08-15: 4 mg via INTRAVENOUS

## 2021-08-15 MED ORDER — FENTANYL CITRATE (PF) 250 MCG/5ML IJ SOLN
INTRAMUSCULAR | Status: AC
  Filled 2021-08-15: qty 5

## 2021-08-15 MED ORDER — FENTANYL CITRATE PF 50 MCG/ML IJ SOSY: 25.0000 ug | PREFILLED_SYRINGE | INTRAMUSCULAR | Status: DC | PRN

## 2021-08-15 MED ORDER — SODIUM CHLORIDE (PF) 0.9 % IJ SOLN
INTRAMUSCULAR | Status: AC
  Filled 2021-08-15: qty 100

## 2021-08-15 MED ORDER — GLYCOPYRROLATE 0.2 MG/ML IJ SOLN
INTRAMUSCULAR | Status: DC | PRN
  Administered 2021-08-15: .2 mg via INTRAVENOUS

## 2021-08-15 MED ORDER — FENTANYL CITRATE (PF) 100 MCG/2ML IJ SOLN
INTRAMUSCULAR | Status: DC | PRN
  Administered 2021-08-15: 100 ug via INTRAVENOUS
  Administered 2021-08-15: 50 ug via INTRAVENOUS

## 2021-08-15 MED ORDER — FLUTICASONE PROPIONATE 50 MCG/ACT NA SUSP
1.0000 | Freq: Every evening | NASAL | Status: DC
  Administered 2021-08-15: 22:00:00 1 via NASAL
  Filled 2021-08-15: qty 16

## 2021-08-15 MED ORDER — CEFAZOLIN SODIUM-DEXTROSE 2-4 GM/100ML-% IV SOLN
2.0000 g | Freq: Once | INTRAVENOUS | Status: AC
  Administered 2021-08-15: 13:00:00 2 g via INTRAVENOUS
  Filled 2021-08-15: qty 100

## 2021-08-15 MED ORDER — PHENYLEPHRINE HCL-NACL 20-0.9 MG/250ML-% IV SOLN
INTRAVENOUS | Status: DC | PRN
  Administered 2021-08-15: 10 ug/min via INTRAVENOUS

## 2021-08-15 MED ORDER — LACTATED RINGERS IV SOLN: INTRAVENOUS | Status: DC

## 2021-08-15 MED ORDER — HYDROMORPHONE 1 MG/ML IV SOLN
INTRAVENOUS | Status: DC
  Administered 2021-08-15: 30 mg via INTRAVENOUS
  Filled 2021-08-15: qty 30

## 2021-08-15 MED ORDER — CHLORHEXIDINE GLUCONATE 0.12 % MT SOLN
15.0000 mL | Freq: Once | OROMUCOSAL | Status: AC
  Administered 2021-08-15: 11:00:00 15 mL via OROMUCOSAL

## 2021-08-15 MED ORDER — DIPHENHYDRAMINE HCL 50 MG/ML IJ SOLN: 12.5000 mg | Freq: Four times a day (QID) | INTRAMUSCULAR | Status: DC | PRN

## 2021-08-15 MED ORDER — PHENYLEPHRINE 40 MCG/ML (10ML) SYRINGE FOR IV PUSH (FOR BLOOD PRESSURE SUPPORT)
PREFILLED_SYRINGE | INTRAVENOUS | Status: DC | PRN
  Administered 2021-08-15: 40 ug via INTRAVENOUS
  Administered 2021-08-15: 120 ug via INTRAVENOUS

## 2021-08-15 MED ORDER — CETIRIZINE HCL 10 MG PO TABS
10.0000 mg | ORAL_TABLET | Freq: Every day | ORAL | Status: DC
  Administered 2021-08-15 – 2021-08-16 (×2): 10 mg via ORAL
  Filled 2021-08-15 (×2): qty 1

## 2021-08-15 MED ORDER — NORGESTIMATE-ETH ESTRADIOL 0.25-35 MG-MCG PO TABS: 1.0000 | ORAL_TABLET | Freq: Every day | ORAL | Status: DC

## 2021-08-15 MED ORDER — ACETAMINOPHEN 500 MG PO TABS
1000.0000 mg | ORAL_TABLET | Freq: Once | ORAL | Status: DC
  Administered 2021-08-15: 11:00:00 1000 mg via ORAL

## 2021-08-15 MED ORDER — NALOXONE HCL 0.4 MG/ML IJ SOLN: 0.4000 mg | INTRAMUSCULAR | Status: DC | PRN

## 2021-08-15 MED ORDER — PROPOFOL 10 MG/ML IV BOLUS
INTRAVENOUS | Status: DC | PRN
  Administered 2021-08-15: 100 mg via INTRAVENOUS

## 2021-08-15 MED ORDER — DEXAMETHASONE SODIUM PHOSPHATE 10 MG/ML IJ SOLN
INTRAMUSCULAR | Status: DC | PRN
  Administered 2021-08-15: 10 mg via INTRAVENOUS

## 2021-08-15 MED ORDER — VALACYCLOVIR HCL 500 MG PO TABS
500.0000 mg | ORAL_TABLET | Freq: Two times a day (BID) | ORAL | Status: DC | PRN
  Filled 2021-08-15: qty 1

## 2021-08-15 MED ORDER — ROCURONIUM BROMIDE 10 MG/ML (PF) SYRINGE
PREFILLED_SYRINGE | INTRAVENOUS | Status: DC | PRN
  Administered 2021-08-15: 50 mg via INTRAVENOUS

## 2021-08-15 MED ORDER — MIDAZOLAM HCL 5 MG/5ML IJ SOLN
INTRAMUSCULAR | Status: DC | PRN
  Administered 2021-08-15: 2 mg via INTRAVENOUS

## 2021-08-15 MED ORDER — DIPHENHYDRAMINE HCL 12.5 MG/5ML PO ELIX
12.5000 mg | ORAL_SOLUTION | Freq: Four times a day (QID) | ORAL | Status: DC | PRN
  Filled 2021-08-15: qty 5

## 2021-08-15 MED ORDER — ACETAMINOPHEN 500 MG PO TABS
ORAL_TABLET | ORAL | Status: AC
  Filled 2021-08-15: qty 2

## 2021-08-15 MED ORDER — ONDANSETRON HCL 4 MG/2ML IJ SOLN
4.0000 mg | Freq: Four times a day (QID) | INTRAMUSCULAR | Status: DC | PRN
  Administered 2021-08-16: 4 mg via INTRAVENOUS
  Filled 2021-08-15: qty 2

## 2021-08-15 MED ORDER — SODIUM CHLORIDE 0.9% FLUSH: 9.0000 mL | INTRAVENOUS | Status: DC | PRN

## 2021-08-15 MED ORDER — SUGAMMADEX SODIUM 200 MG/2ML IV SOLN
INTRAVENOUS | Status: DC | PRN
  Administered 2021-08-15: 200 mg via INTRAVENOUS
  Administered 2021-08-15: 60 mg via INTRAVENOUS

## 2021-08-15 NOTE — Anesthesia Postprocedure Evaluation (Signed)
Anesthesia Post Note  Patient: Kelly Moreno  Procedure(s) Performed: IR WITH ANESTHESIA RENAL CRYOABLATION (Right)     Patient location during evaluation: PACU Anesthesia Type: General Level of consciousness: awake and alert and oriented Pain management: pain level controlled Vital Signs Assessment: post-procedure vital signs reviewed and stable Respiratory status: spontaneous breathing, nonlabored ventilation and respiratory function stable Cardiovascular status: blood pressure returned to baseline and stable Postop Assessment: no apparent nausea or vomiting Anesthetic complications: no   No notable events documented.  Last Vitals:  Vitals:   08/15/21 1530 08/15/21 1545  BP: 124/84 131/80  Pulse: 94 76  Resp: 11 13  Temp:    SpO2: 100% 100%    Last Pain:  Vitals:   08/15/21 1526  TempSrc:   PainSc: 0-No pain                 Clevland Cork A.

## 2021-08-15 NOTE — Procedures (Signed)
Pre procedural Dx: Right sided renal lesion Post procedural Dx: Same  Technically successful CT and US guided cryoablation of right sided renal lesion.   EBL: Trace.  Complications: None immediate.   Ronny Bacon, MD Pager #: [phone]

## 2021-08-15 NOTE — Anesthesia Preprocedure Evaluation (Addendum)
Anesthesia Evaluation  Patient identified by MRN, date of birth, ID band Patient awake    Reviewed: Allergy & Precautions, H&P , NPO status , Patient's Chart, lab work & pertinent test results  Airway Mallampati: II  TM Distance: >3 FB Neck ROM: Full    Dental no notable dental hx. (+) Teeth Intact, Dental Advisory Given   Pulmonary neg pulmonary ROS,    Pulmonary exam normal breath sounds clear to auscultation       Cardiovascular negative cardio ROS   Rhythm:Regular Rate:Normal     Neuro/Psych Depression negative neurological ROS     GI/Hepatic negative GI ROS, Neg liver ROS,   Endo/Other  negative endocrine ROS  Renal/GU negative Renal ROS  negative genitourinary   Musculoskeletal   Abdominal   Peds  Hematology  (+) Blood dyscrasia, anemia ,   Anesthesia Other Findings   Reproductive/Obstetrics negative OB ROS                            Anesthesia Physical Anesthesia Plan  ASA: 2  Anesthesia Plan: General   Post-op Pain Management: Tylenol PO (pre-op)   Induction: Intravenous  PONV Risk Score and Plan: 4 or greater and Ondansetron, Dexamethasone and Midazolam  Airway Management Planned: Oral ETT  Additional Equipment:   Intra-op Plan:   Post-operative Plan: Extubation in OR  Informed Consent: I have reviewed the patients History and Physical, chart, labs and discussed the procedure including the risks, benefits and alternatives for the proposed anesthesia with the patient or authorized representative who has indicated his/her understanding and acceptance.     Dental advisory given  Plan Discussed with: CRNA  Anesthesia Plan Comments:         Anesthesia Quick Evaluation

## 2021-08-15 NOTE — Transfer of Care (Signed)
Immediate Anesthesia Transfer of Care Note  Patient: Kelly Moreno  Procedure(s) Performed: Procedure(s): IR WITH ANESTHESIA RENAL CRYOABLATION (Right)  Patient Location: PACU  Anesthesia Type:General  Level of Consciousness: Alert, Awake, Oriented  Airway & Oxygen Therapy: Patient Spontanous Breathing  Post-op Assessment: Report given to RN  Post vital signs: Reviewed and stable  Last Vitals:  Vitals:   08/15/21 1056  BP: 103/60  Pulse: 74  Resp: 17  Temp: 37.2 C  SpO2: 46%    Complications: No apparent anesthesia complications

## 2021-08-15 NOTE — Anesthesia Procedure Notes (Signed)
Procedure Name: Intubation Date/Time: 08/15/2021 12:44 PM Performed by: Gerald Leitz, CRNA Pre-anesthesia Checklist: Patient identified, Patient being monitored, Timeout performed, Emergency Drugs available and Suction available Patient Re-evaluated:Patient Re-evaluated prior to induction Oxygen Delivery Method: Circle system utilized Preoxygenation: Pre-oxygenation with 100% oxygen Induction Type: IV induction Ventilation: Mask ventilation without difficulty Laryngoscope Size: Mac and 3 Grade View: Grade I Tube type: Oral Tube size: 7.0 mm Number of attempts: 2 Airway Equipment and Method: Stylet Placement Confirmation: ETT inserted through vocal cords under direct vision, positive ETCO2 and breath sounds checked- equal and bilateral Secured at: 23 cm Tube secured with: Tape Dental Injury: Teeth and Oropharynx as per pre-operative assessment  Difficulty Due To: Difficult Airway- due to anterior larynx

## 2021-08-16 ENCOUNTER — Encounter (HOSPITAL_COMMUNITY): Payer: Self-pay | Admitting: Interventional Radiology

## 2021-08-16 DIAGNOSIS — C641 Malignant neoplasm of right kidney, except renal pelvis: Secondary | ICD-10-CM | POA: Diagnosis not present

## 2021-08-16 MED ORDER — ACETAMINOPHEN 500 MG PO TABS: 500.0000 mg | ORAL_TABLET | ORAL | Status: DC | PRN

## 2021-08-16 MED ORDER — INFLUENZA VAC SPLIT QUAD 0.5 ML IM SUSY: 0.5000 mL | PREFILLED_SYRINGE | INTRAMUSCULAR | Status: DC

## 2021-08-16 MED ORDER — ACETAMINOPHEN 500 MG PO TABS: 500.0000 mg | ORAL_TABLET | ORAL | 0 refills | Status: AC | PRN

## 2021-08-16 MED ORDER — DOCUSATE SODIUM 100 MG PO CAPS: 100.0000 mg | ORAL_CAPSULE | Freq: Two times a day (BID) | ORAL | Status: DC | PRN

## 2021-08-16 MED ORDER — OXYCODONE HCL 5 MG PO TABS: 5.0000 mg | ORAL_TABLET | Freq: Four times a day (QID) | ORAL | 0 refills | Status: AC | PRN

## 2021-08-16 MED ORDER — INFLUENZA VAC SPLIT QUAD 0.5 ML IM SUSY
0.5000 mL | PREFILLED_SYRINGE | Freq: Once | INTRAMUSCULAR | Status: AC
  Administered 2021-08-16: 0.5 mL via INTRAMUSCULAR
  Filled 2021-08-16: qty 0.5

## 2021-08-16 MED ORDER — OXYCODONE-ACETAMINOPHEN 5-325 MG PO TABS: 1.0000 | ORAL_TABLET | Freq: Four times a day (QID) | ORAL | Status: DC | PRN

## 2021-08-16 MED ORDER — OXYCODONE HCL 5 MG PO TABS: 5.0000 mg | ORAL_TABLET | ORAL | Status: DC | PRN

## 2021-08-16 MED ORDER — DOCUSATE SODIUM 100 MG PO CAPS: 100.0000 mg | ORAL_CAPSULE | Freq: Two times a day (BID) | ORAL | 0 refills | Status: AC | PRN

## 2021-08-16 MED ORDER — ACETAMINOPHEN 500 MG PO TABS
500.0000 mg | ORAL_TABLET | ORAL | Status: DC | PRN
  Administered 2021-08-16: 500 mg via ORAL
  Filled 2021-08-16: qty 1

## 2021-08-16 MED ORDER — ONDANSETRON HCL 4 MG PO TABS: 4.0000 mg | ORAL_TABLET | Freq: Three times a day (TID) | ORAL | 0 refills | Status: AC | PRN

## 2021-08-16 MED ORDER — PHENOL 1.4 % MT LIQD
1.0000 | OROMUCOSAL | Status: DC | PRN
  Filled 2021-08-16 (×2): qty 177

## 2021-08-16 NOTE — Plan of Care (Signed)
DC paperwork reviewed with pt and family. Questions answered. PIV removed.   Problem: Education: Goal: Knowledge of General Education information will improve Description: Including pain rating scale, medication(s)/side effects and non-pharmacologic comfort measures Outcome: Completed/Met   Problem: Health Behavior/Discharge Planning: Goal: Ability to manage health-related needs will improve Outcome: Completed/Met   Problem: Clinical Measurements: Goal: Ability to maintain clinical measurements within normal limits will improve Outcome: Completed/Met Goal: Will remain free from infection Outcome: Completed/Met Goal: Diagnostic test results will improve Outcome: Completed/Met Goal: Respiratory complications will improve Outcome: Completed/Met Goal: Cardiovascular complication will be avoided Outcome: Completed/Met   Problem: Activity: Goal: Risk for activity intolerance will decrease Outcome: Completed/Met   Problem: Nutrition: Goal: Adequate nutrition will be maintained Outcome: Completed/Met   Problem: Coping: Goal: Level of anxiety will decrease Outcome: Completed/Met   Problem: Elimination: Goal: Will not experience complications related to bowel motility Outcome: Completed/Met Goal: Will not experience complications related to urinary retention Outcome: Completed/Met   Problem: Pain Managment: Goal: General experience of comfort will improve Outcome: Completed/Met   Problem: Safety: Goal: Ability to remain free from injury will improve Outcome: Completed/Met   Problem: Skin Integrity: Goal: Risk for impaired skin integrity will decrease Outcome: Completed/Met

## 2021-08-16 NOTE — Progress Notes (Signed)
°  Transition of Care [facility]) Screening Note   Patient Details  Name: Kelly Moreno Date of Birth: March 02, 1976   Transition of Care [facility]) CM/SW Contact:    Laretta Pyatt, Marjie Skiff, RN Phone Number: 08/16/2021, 11:43 AM    Transition of Care Department [facility]) has reviewed patient and no TOC needs have been identified at this time. We will continue to monitor patient advancement through interdisciplinary progression rounds. If new patient transition needs arise, please place a TOC consult.

## 2021-08-16 NOTE — Discharge Instructions (Signed)
Cryoablation of Renal Tumors, Care After  This sheet gives you information about how to care for yourself after your procedure. Your health care provider may also give you more specific instructions. If you have problems or questions, contact your health care provider.  What can I expect after the procedure? After your procedure, it is common to have pain and discomfort in the upper abdomen. You may be given prescription pain medicines to control this if the pain is severe.   Follow these instructions at home: Puncture site care Follow instructions from your health care provider about how to take care of your incision. Make sure you:  - Wash your hands with soap and water before you change your bandage (dressing). If soap and water are not available, use hand sanitizer.  - Ok to shower 48 hours following procedure. Recommend showering with bandage on, remove bandage immediately after showering and pat area dry. No further dressing changes needed after this- ensure area remains clean and dry until fully healed.  - No submerging (swimming, bathing) for 7 days post-procedure. Check your puncture site area every day for signs of infection. Check for: - Redness, swelling, or pain. - Fluid or blood. - Warmth. - Pus or a bad smell. Activity Rest as often as needing during the first few days of recovery. No stooping, bending, or lifting more than 10 pounds for 1 week.  General instructions To prevent or treat constipation while you are taking prescription pain medicine, your health care provider may recommend that you: - Drink enough fluid to keep your urine clear or pale yellow. - Take over-the-counter or prescription medicines. - Eat foods that are high in fiber, such as fresh fruits and vegetables, whole grains, and beans. - Limit foods that are high in fat and processed sugars, such as fried and sweet foods. - Do not use any products that contain nicotine or tobacco, such as cigarettes and  e-cigarettes. If you need help quitting, ask your health care provider. - Keep all follow-up visits as told by your health care provider. This is important.  3-4 week televisit with the doctor who performed the procedure. Our office will call you to set up the appointment.   Contact a health care provider if: You cannot pass gas. You are unable to have a bowel movement within 3 days. You have a skin rash.  Get help right away if: You have a fever. You have severe or lasting pain in your abdomen, shoulder, or back. You have trouble swallowing or breathing. You have severe weakness or dizziness. You have chest pain or shortness of breath.   This information is not intended to replace advice given to you by your health care provider. Make sure you discuss any questions you have with your health care provider. 

## 2021-08-16 NOTE — Discharge Summary (Signed)
Patient ID: Kelly Moreno MRN: [id number] DOB/AGE: 04/18/1976 45 y.o.  Admit date: 08/15/2021 Discharge date: 08/16/2021  Supervising Physician: Aletta Edouard  Patient Status: [facility] - In-pt  Admission Diagnoses: right renal mass   Discharge Diagnoses:  Principal Problem:   Renal cell carcinoma of right kidney [facility])   Discharged Condition: good  Hospital Course:   Patient presented to [facility] IR for an image-guided right renal mass cryoablation with Dr. Pascal Lux. Procedure occurred without major complications and patient was transferred to floor in stable condition (VSS, right flank puncture site stable) for overnight observation. No major events occurred overnight.   Patient awake and alert  no complaints at this time. Foley catheter removed without difficulty. Patient denies right flank pain or hematuria. Right flank puncture site stable. Plan to discharge home today and follow-up with Dr.Watts  for televisit 3-4 weeks after discharge.   Consults: None  Significant Diagnostic Studies: none   Treatments: Right renal cryoablation   Discharge Exam: Blood pressure (!) 92/54, pulse (!) 56, temperature (!) 97.4 F (36.3 C), temperature source Oral, resp. rate 14, height 5\' 2"  (1.575 m), weight 187 lb 9 oz (85.1 kg), last menstrual period 08/08/2021, SpO2 100 %.  Physical Exam Vitals and nursing note reviewed.  Constitutional:      General: Patient is not in acute distress.    Appearance: Normal appearance. Patient is not ill-appearing.  HENT:     Head: Normocephalic and atraumatic.     Mouth/Throat:     Mouth: Mucous membranes are moist.     Pharynx: Oropharynx is clear.  Cardiovascular:     Rate and Rhythm: Normal rate and regular rhythm.     Pulses: Normal pulses.     Heart sounds: Normal heart sounds.  Pulmonary:     Effort: Pulmonary effort is normal.     Breath sounds: Normal breath sounds.  Abdominal:     General: Abdomen is flat. Bowel sounds are normal.      Palpations: Abdomen is soft.  Musculoskeletal:     Cervical back: Neck supple.  Skin:    General: Skin is warm and dry.     Coloration: Skin is not jaundiced or pale.  Positive dressing on right puncture site. Site is unremarkable with no erythema, edema, tenderness, bleeding or drainage. Minimal amount of old, dry blood noted on the dressing. Dressing otherwise clean, dry, and intact.   Neurological:     Mental Status: Patient is alert and oriented to person, place, and time.  Psychiatric:        Mood and Affect: Mood normal.        Behavior: Behavior normal.        Judgment: Judgment normal.    Disposition:    Allergies as of 08/16/2021   No Known Allergies      Medication List     TAKE these medications    acetaminophen 500 MG tablet Commonly known as: TYLENOL Take 1 tablet (500 mg total) by mouth every 4 (four) hours as needed for up to 5 days for moderate pain or mild pain. Do not take this medicine with Midol Complete, max does of acetaminophen (Tylenol) is a 4,000 mg a day.   Advil Cold/Sinus 30-200 MG Tabs Generic drug: Pseudoephedrine-Ibuprofen Take 1 tablet by mouth daily as needed (cold symptoms).   calcipotriene-betamethasone ointment Commonly known as: TACLONEX Apply 1 application topically once a week. Applied to affected area(s) of skin   Clobetasol Propionate 0.05 % shampoo Apply 1 application  topically once a week.   docusate sodium 100 MG capsule Commonly known as: COLACE Take 1 capsule (100 mg total) by mouth 2 (two) times daily as needed for mild constipation.   Fluocinolone Acetonide Body 0.01 % Oil Apply 1 application topically once a week. Applied to scalp   fluticasone 50 MCG/ACT nasal spray Commonly known as: FLONASE Place 1 spray into both nostrils every evening.   ibuprofen 200 MG tablet Commonly known as: ADVIL Take 200 mg by mouth every 8 (eight) hours as needed (menstrual pain.).   Junel 1/20 1-20 MG-MCG tablet Generic drug:  norethindrone-ethinyl estradiol Take 1 tablet by mouth at bedtime.   levocetirizine 5 MG tablet Commonly known as: XYZAL Take 5 mg by mouth every evening.   Midol Complete 500-60-15 MG Tabs Generic drug: Acetaminophen-Caff-Pyrilamine Take 1 tablet by mouth 3 (three) times daily as needed (menstrual pain.).   multivitamin Liqd Take 15 mLs by mouth 2 (two) times a week. NutraBurst Liquid Multivitamin   multivitamin with minerals Tabs tablet Take 1 tablet by mouth every evening.   norgestimate-ethinyl estradiol 0.25-35 MG-MCG tablet Commonly known as: ORTHO-CYCLEN Take 1 tablet by mouth at bedtime.   ondansetron 4 MG tablet Commonly known as: Zofran Take 1 tablet (4 mg total) by mouth every 8 (eight) hours as needed for up to 3 days for nausea or vomiting.   oxyCODONE 5 MG immediate release tablet Commonly known as: Oxy IR/ROXICODONE Take 1 tablet (5 mg total) by mouth every 6 (six) hours as needed for up to 3 days for severe pain or breakthrough pain (May take if PRN Tylenol ineffective.).   valACYclovir 500 MG tablet Commonly known as: VALTREX Take 500 mg by mouth 2 (two) times daily as needed (fever blisters/cold sores).        Follow-up Information     Sandi Mariscal, MD Follow up.   Specialties: Interventional Radiology, Radiology Why: Follow up televisit with Dr. Pascal Lux in 3-4 weeks. Our schedulers will call you to set up the appointment. Contact information: [address] [place] [address] [phone]                  Electronically Signed: Tera Mater, PA-C 08/16/2021, 2:22 PM   I have spent Less Than 30 Minutes discharging Kelly Moreno.

## 2021-08-17 ENCOUNTER — Other Ambulatory Visit: Payer: Self-pay | Admitting: *Deleted

## 2021-08-17 DIAGNOSIS — N2889 Other specified disorders of kidney and ureter: Secondary | ICD-10-CM

## 2021-09-29 LAB — BASIC METABOLIC PANEL
BUN: 15 mg/dL (ref 7–25)
CO2: 26 mmol/L (ref 20–32)
Calcium: 9.1 mg/dL (ref 8.6–10.2)
Chloride: 101 mmol/L (ref 98–110)
Creat: 0.81 mg/dL (ref 0.50–0.99)
Glucose, Bld: 82 mg/dL (ref 65–139)
Potassium: 4 mmol/L (ref 3.5–5.3)
Sodium: 138 mmol/L (ref 135–146)

## 2021-10-02 ENCOUNTER — Other Ambulatory Visit: Payer: Self-pay

## 2021-10-02 ENCOUNTER — Encounter: Payer: Self-pay | Admitting: *Deleted

## 2021-10-02 ENCOUNTER — Ambulatory Visit
Admission: RE | Admit: 2021-10-02 | Discharge: 2021-10-02 | Disposition: A | Discharge status: Home / Self Care | Payer: Managed Care, Other (non HMO) | Source: Ambulatory Visit | Attending: Radiology | Admitting: Radiology

## 2021-10-02 DIAGNOSIS — N289 Disorder of kidney and ureter, unspecified: Secondary | ICD-10-CM

## 2021-10-02 HISTORY — PX: IR RADIOLOGIST EVAL & MGMT: IMG5224

## 2021-10-02 NOTE — Progress Notes (Signed)
Patient ID: Kelly Moreno, female   DOB: 1976-07-29, 46 y.o.   MRN: [id number]        Chief Complaint: Post right-sided renal cryoablation (08/15/2021)  Referring Physician(s): Gay,Matthew R   History of Present Illness: Kelly Moreno is a 46 y.o. female with past medical history significant for seasonal allergies who is seen today and telemedicine consultation following technically successful right-sided renal cryoablation performed 08/15/2021 for incidentally discovered right-sided renal lesion.    In brief review, the right-sided renal lesion was initially discovered on renal protocol CT scan of the abdomen pelvis performed 05/31/2021 performed for the indication of microscopic hematuria. The lesion was further evaluated on abdominal MRI performed 06/19/2021 demonstrated an approximately 1.3 x 1.1 cm enhancing lesion involving the mid lateral aspect of the right kidney with imaging characteristics worrisome for a renal cell carcinoma.  The patient was initially seen and consultation on 07/19/2021 and ultimately underwent right-sided renal cryoablation on 08/15/2021.  Note, biopsy was not performed at the time of the cryoablation given the small size of the renal lesion.  Patient states that she has recovered well from the cryoablation and is presently in without procedure related complaint.  She does report that she is experiencing some bladder tingling which she says is usually attributable to urinary tract infection.  Patient states that she has had multiple urinary tract infections in the past.  She denies fever or chills.   Past Medical History:  Diagnosis Date   Anemia    Seasonal allergies     Past Surgical History:  Procedure Laterality Date   IR RADIOLOGIST EVAL & MGMT  07/19/2021   NO PAST SURGERIES     RADIOLOGY WITH ANESTHESIA Right 08/15/2021   Procedure: IR WITH ANESTHESIA RENAL CRYOABLATION;  Surgeon: Sandi Mariscal, MD;  Location: [facility];  Service: Anesthesiology;   Laterality: Right;    Allergies: Patient has no known allergies.  Medications: Prior to Admission medications   Medication Sig Start Date End Date Taking? Authorizing Provider  Acetaminophen-Caff-Pyrilamine (MIDOL COMPLETE) 500-60-15 MG TABS Take 1 tablet by mouth 3 (three) times daily as needed (menstrual pain.).    [provider]  calcipotriene-betamethasone (TACLONEX) ointment Apply 1 application topically once a week. Applied to affected area(s) of skin 06/17/21   [provider]  Clobetasol Propionate 0.05 % shampoo Apply 1 application topically once a week. 06/17/21   [provider]  docusate sodium (COLACE) 100 MG capsule Take 1 capsule (100 mg total) by mouth 2 (two) times daily as needed for mild constipation. 08/16/21   Han, Aimee H, PA-C  Fluocinolone Acetonide Body 0.01 % OIL Apply 1 application topically once a week. Applied to scalp 06/17/21   [provider]  fluticasone (FLONASE) 50 MCG/ACT nasal spray Place 1 spray into both nostrils every evening.    [provider]  ibuprofen (ADVIL) 200 MG tablet Take 200 mg by mouth every 8 (eight) hours as needed (menstrual pain.).    [provider]  JUNEL 1/20 1-20 MG-MCG tablet Take 1 tablet by mouth at bedtime. 06/17/21   [provider]  levocetirizine (XYZAL) 5 MG tablet Take 5 mg by mouth every evening.    [provider]  Multiple Vitamin (MULTIVITAMIN WITH MINERALS) TABS tablet Take 1 tablet by mouth every evening.    [provider]  Multiple Vitamin (MULTIVITAMIN) LIQD Take 15 mLs by mouth 2 (two) times a week. NutraBurst Liquid Multivitamin    [provider]  norgestimate-ethinyl estradiol (ORTHO-CYCLEN) 0.25-35  MG-MCG tablet Take 1 tablet by mouth at bedtime.    [provider]  Pseudoephedrine-Ibuprofen (ADVIL COLD/SINUS) 30-200 MG TABS Take 1 tablet by mouth daily as needed (cold symptoms).    [provider]   valACYclovir (VALTREX) 500 MG tablet Take 500 mg by mouth 2 (two) times daily as needed (fever blisters/cold sores). 06/17/21   [provider]     Family History  Problem Relation Age of Onset   High blood pressure Mother    High Cholesterol Mother    High blood pressure Father    High Cholesterol Father    Osteoarthritis Maternal Grandmother    Cancer Maternal Grandmother        blood; ended up turning to leukemia   Alcohol abuse Maternal Grandfather    Heart disease Maternal Grandfather    Osteoarthritis Paternal Grandmother    Kidney failure Paternal Grandmother    Lung cancer Paternal Grandfather        smoker    Social History   Socioeconomic History   Marital status: Single    Spouse name: Not on file   Number of children: Not on file   Years of education: Not on file   Highest education level: Not on file  Occupational History   Not on file  Tobacco Use   Smoking status: Never   Smokeless tobacco: Never  Substance and Sexual Activity   Alcohol use: Yes    Alcohol/week: 4.0 standard drinks    Types: 4 Standard drinks or equivalent per week   Drug use: Never   Sexual activity: Not on file  Other Topics Concern   Not on file  Social History Narrative   Not on file   Social Determinants of Health   Financial Resource Strain: Not on file  Food Insecurity: Not on file  Transportation Needs: Not on file  Physical Activity: Not on file  Stress: Not on file  Social Connections: Not on file    ECOG Status: 0 - Asymptomatic  Review of Systems  Review of Systems: A 12 point ROS discussed and pertinent positives are indicated in the HPI above.  All other systems are negative.  Physical Exam No direct physical exam was performed (except for noted visual exam findings with Video Visits).   Vital Signs: There were no vitals taken for this visit.  Imaging:   Personal review of CT scan of the abdomen pelvis performed 05/31/2021 as well as abdominal  MRI performed 06/19/2021 demonstrates an enhancing approximately 1.3 x 1.1 cm lesion arising from the anterior lateral interpolar aspect of the right kidney.  The lesion appears to abut the adjacent posterior medial aspect of the liver without definitive direct invasion.  The right renal vein appears widely patent without involvement.  No regional lymphadenopathy.  Review of intraprocedural images during right-sided renal cryoablation performed 08/15/2021 demonstrates a technically excellent result with the ice ball encompassing the entirety of the partially exophytic hyperattenuating right-sided renal lesion.  Labs:  CBC: Recent Labs    08/09/21 0814 08/15/21 1045  WBC 7.3 8.8  HGB 13.1 13.4  HCT 40.4 42.3  PLT 294 250    COAGS: Recent Labs    08/15/21 1045  INR 1.0    BMP: Recent Labs    08/09/21 0814 08/15/21 1045 09/28/21 1646  NA 136 134* 138  K 3.9 4.4 4.0  CL 102 103 101  CO2 28 21* 26  GLUCOSE 86 77 82  BUN 18 17 15   CALCIUM 9.0 9.2  9.1  CREATININE 0.87 0.78 0.81  GFRNONAA >60 >60  --     LIVER FUNCTION TESTS: No results for input(s): BILITOT, AST, ALT, ALKPHOS, PROT, ALBUMIN in the last 8760 hours.  TUMOR MARKERS: No results for input(s): AFPTM, CEA, CA199, CHROMGRNA in the last 8760 hours.  Assessment and Plan:  Kelly Moreno is a 46 y.o. female with past medical history significant for seasonal allergies who is seen today and telemedicine consultation following technically successful right-sided renal cryoablation performed 08/15/2021 for incidentally discovered right-sided renal lesion.   Note, biopsy was not performed at the time of the cryoablation given the small size of the renal lesion.  Patient has recovered completely from the procedure and is without procedure related complaint.  Review of intraprocedural images during right-sided renal cryoablation performed 08/15/2021 demonstrates a technically excellent result with the ice ball  encompassing the entirety of the partially exophytic hyperattenuating right-sided renal lesion.  Patient does report that she suspects she has a recurrent urinary tract infection and as such she was encouraged to reach out to her OB/GYN who typically manages her frequent UTIs.  Initial postprocedural MRI was performed 3 months following the cryoablation (mid March 2023). Initial plan is to perform surveillance imaging every 3 months for the first year followed by every 6 months for the next year with a completion examination performed 3 years following the procedure.  The patient demonstrating excellent understanding of the above plan of care.  PLAN:  - Telemedicine consultation following acquisition of postprocedural renal protocol MRI 3 months following the cryoablation (in mid March 2023)  A copy of this report was sent to the requesting provider on this date.  Electronically Signed: Sandi Mariscal 10/02/2021, 8:04 AM   I spent a total of 10 Minutes in remote  clinical consultation, greater than 50% of which was counseling/coordinating care for right-sided renal cryoablation.    Visit type: Audio only (telephone). Audio (no video) only due to patient's lack of internet/smartphone capability. Alternative for in-person consultation at [facility], [address] [place], [place], [place]. This visit type was conducted due to national recommendations for restrictions regarding the COVID-19 Pandemic (e.g. social distancing).  This format is felt to be most appropriate for this patient at this time.  All issues noted in this document were discussed and addressed.

## 2021-10-24 ENCOUNTER — Other Ambulatory Visit: Payer: Self-pay | Admitting: Interventional Radiology

## 2021-10-24 DIAGNOSIS — N2889 Other specified disorders of kidney and ureter: Secondary | ICD-10-CM

## 2021-10-24 IMAGING — US CT GUIDANCE TISSUE ABLATION
1 series · 3 of 3 positions shown · non-contrast
Comparison: none

CLINICAL DATA: Right-sided renal lesion. Patient presents today for
image guided right renal cryoablation.

[Series 1: ct guidance tissue ablation · 3 of 3 slices shown]
[im 1/3]
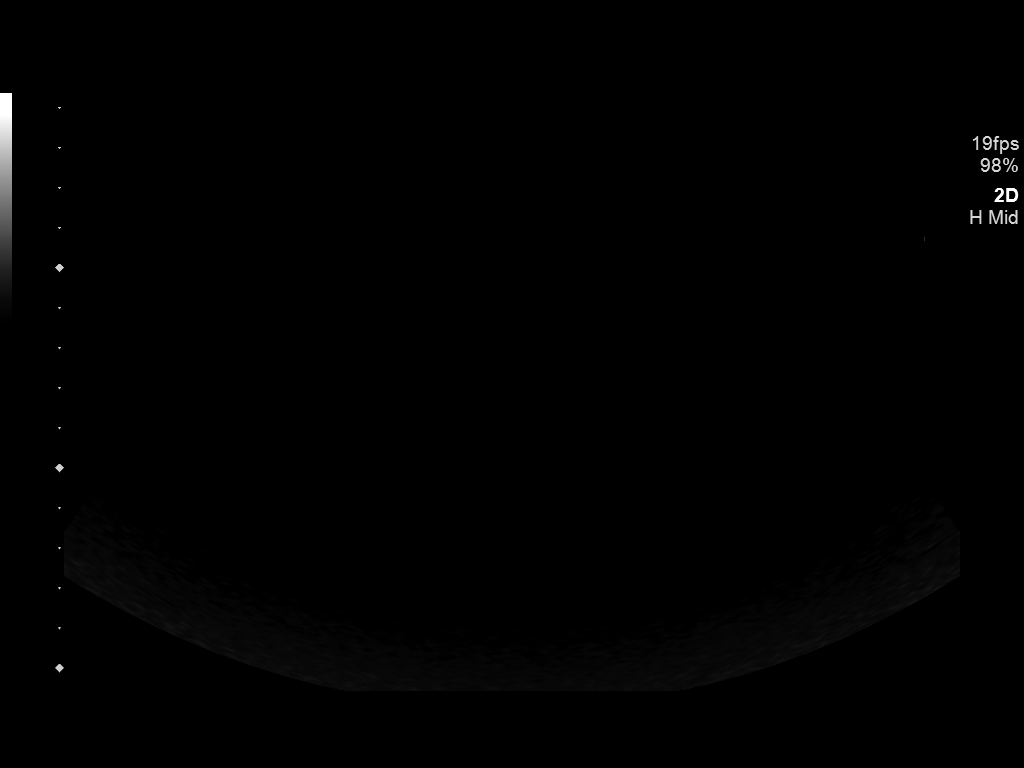
[im 2/3]
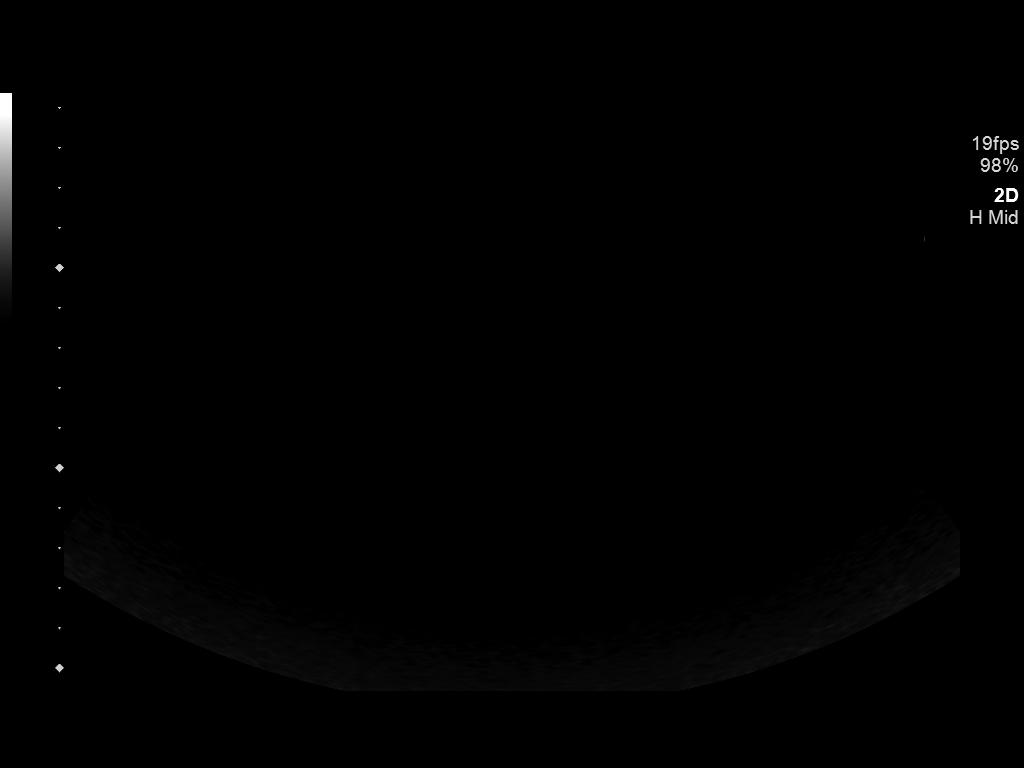
[im 3/3]
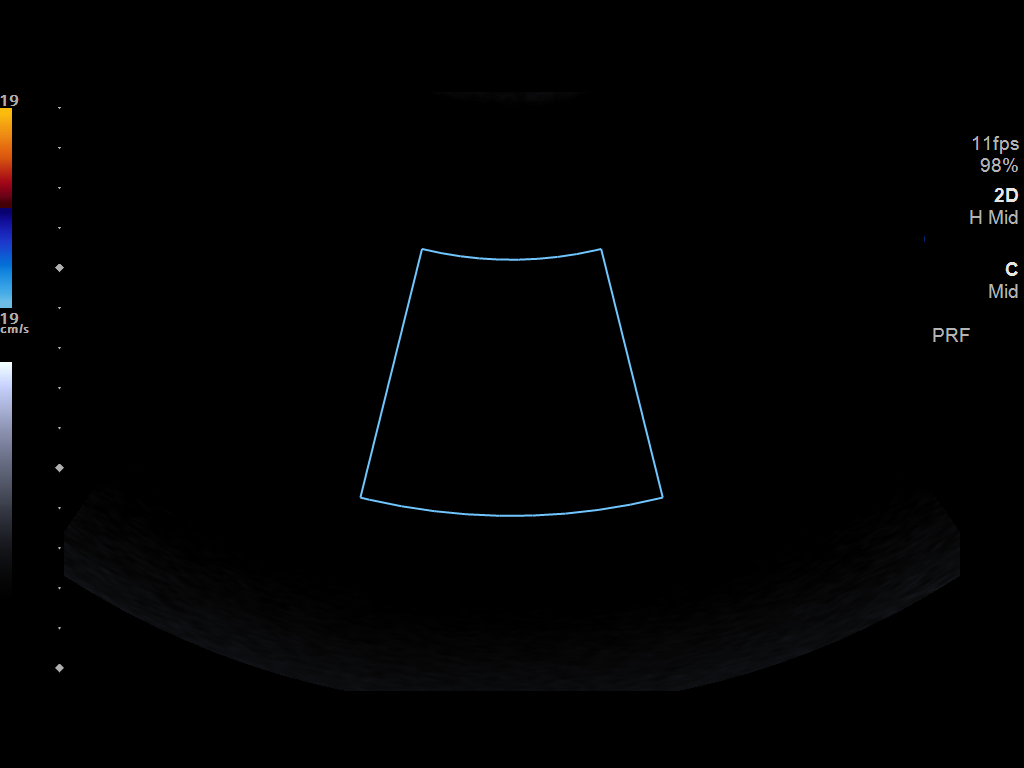

[3 of 3 positions shown; findings below may reference images not displayed]

EXAM:
CT AND ULTRASOUND-GUIDED RIGHT RENAL CRYOABLATION

ANESTHESIA/SEDATION:
General

MEDICATIONS:
Ancef 2 g IV; the antibiotic was administered in an appropriate time
interval prior to needle puncture of the skin.

CONTRAST:  None

PROCEDURE:
The procedure, risks, benefits, and alternatives were explained to
the patient. Questions regarding the procedure were encouraged and
answered. The patient understands and consents to the procedure.

The patient was placed under general anesthesia. Initial un-enhanced
CT was performed in a prone position to localize the indeterminate
right-sided renal lesion. The CT gantry table position was marked
and the lesion was identified sonographically.

The skin overlying the right flank was prepped and draped in usual
sterile fashion. Appropriate trajectory was planned with the use of
a 22 gauge spinal needle.

Next, under intermittent CT and ultrasound guidance, a 13.5 gauge
TASER Agility cryoablation probe was advanced into the slightly
hypoattenuating right-sided renal lesion. Appropriate positioning
was confirmed with CT imaging.

Note, given the small size of the lesion, a biopsy was not obtained.

Next, a 22 gauge spinal needle was advanced into the right pararenal
space and approximately 150 cc saline and dilute contrast was
administered the purposes of hydrodissection.

Final positioning was confirmed with CT imaging and the cryoablation
was performed.

Initial 10 minute cycle of cryoablation was performed, followed by a
8 minute thaw cycle, followed by a second 10 minute cycle of
cryoablation. During ablation, periodic CT imaging was performed to
monitor ice ball formation and morphology. After active thaw, the
cryoablation probe was removed and dressings were applied.

COMPLICATIONS:
None immediate.
FINDINGS: Preprocedural imaging demonstrates unchanged size and appearance of
the slightly hyperattenuating partially exophytic at least 1.6 x
cm lesion arising from the lateral interpolar aspect the right
kidney (image 18, series 3).

Utilizing a combination of CT and ultrasound guidance, the
cryoablation probe was appropriately positioned through the
partially exophytic right renal lesion.

Next, the cryoablation was then performed with imaging demonstrating
the ice ball encompassing the entirety of the expected location of
the partially exophytic right renal lesion (series 18).
IMPRESSION: Technically successful CT and ultrasound guided percutaneous core
cryoablation of worrisome right-sided renal lesion.

PLAN:
- The patient will be admitted overnight for continued observation
and PCA usage.

- Telemedicine consultation will performed in approximately 4 weeks

- Initial postprocedural MRI will be performed in 3 months (mid
[DATE]).

## 2021-11-08 ENCOUNTER — Other Ambulatory Visit: Payer: Self-pay | Admitting: Urology

## 2021-11-09 ENCOUNTER — Other Ambulatory Visit: Payer: Self-pay | Admitting: Urology

## 2021-11-09 DIAGNOSIS — D49511 Neoplasm of unspecified behavior of right kidney: Secondary | ICD-10-CM

## 2021-11-19 ENCOUNTER — Other Ambulatory Visit (HOSPITAL_COMMUNITY): Payer: Managed Care, Other (non HMO)

## 2021-11-19 ENCOUNTER — Encounter (HOSPITAL_COMMUNITY): Payer: Self-pay

## 2021-11-26 ENCOUNTER — Telehealth: Payer: Managed Care, Other (non HMO)

## 2021-11-29 ENCOUNTER — Ambulatory Visit
Admission: RE | Admit: 2021-11-29 | Discharge: 2021-11-29 | Disposition: A | Discharge status: Home / Self Care | Payer: Managed Care, Other (non HMO) | Source: Ambulatory Visit | Attending: Urology | Admitting: Urology

## 2021-11-29 DIAGNOSIS — D49511 Neoplasm of unspecified behavior of right kidney: Secondary | ICD-10-CM

## 2021-11-29 IMAGING — MR MR ABDOMEN WO/W CM
17 of 18 series · 45 of 48 positions shown · IV contrast (multihance)
Comparison: MRI [DATE]

CLINICAL DATA: History of right renal neoplasm status post
cryoablation [DATE].

EXAM:
MRI ABDOMEN WITHOUT AND WITH CONTRAST
TECHNIQUE: Multiplanar multisequence MR imaging of the abdomen was performed
both before and after the administration of intravenous contrast.
CONTRAST:  18mL MULTIHANCE GADOBENATE DIMEGLUMINE 529 MG/ML IV SOLN

[Series 4: T2 · coronal · 5.0mm · 0.74mm/px · 2 of 22 slices shown (1 of 3)]
[im 1/22]
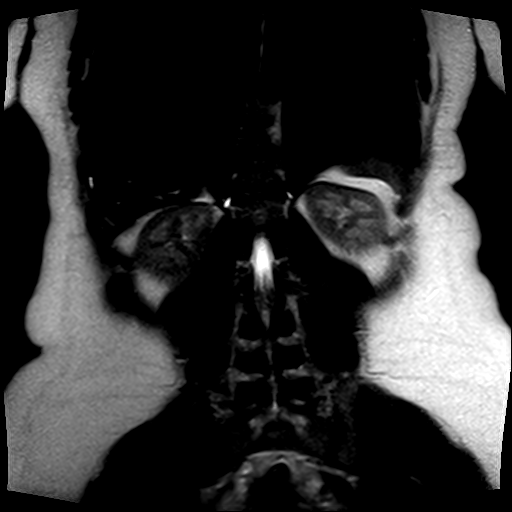
[im 22/22]
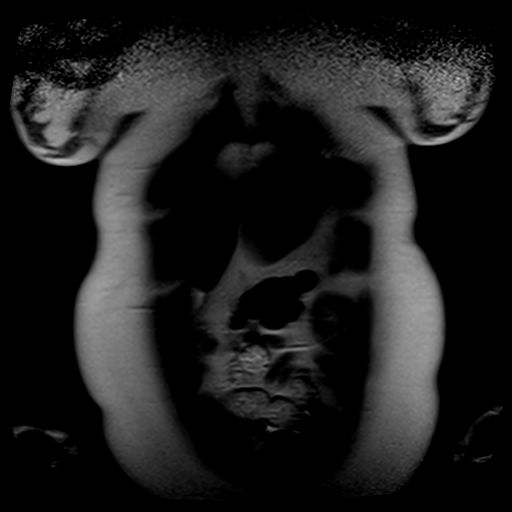

[Series 5: T2 · axial · 5.0mm · 0.68mm/px · z∈[-28,+139]mm · 2 of 29 slices shown (2 of 3)]
[im 1/29]
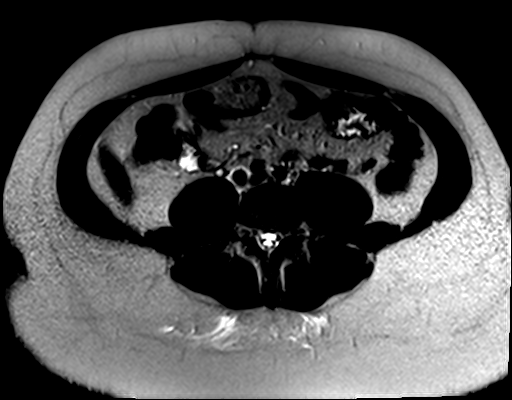
[im 29/29]
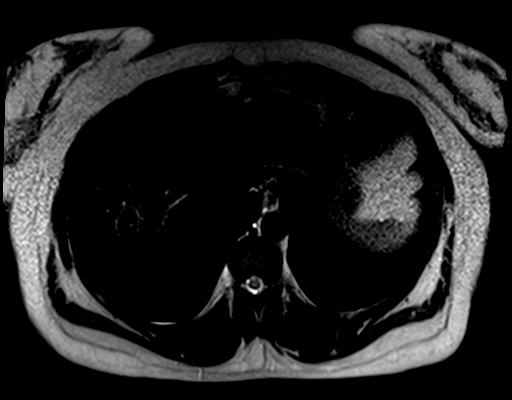

[Series 6: ep2d_diff_b50_500_800_p2_trig · axial · 5.0mm · 1.82mm/px · z∈[-7,+166]mm · 5 of 90 slices shown]
[im 1/90]
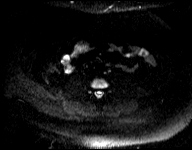
[im 23/90]
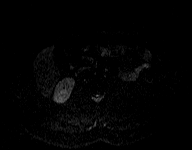
[im 45/90]
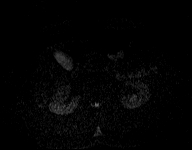
[im 67/90]
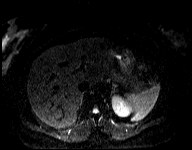
[im 90/90]
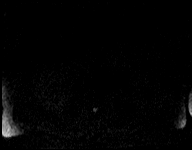

[Series 7: ep2d_diff_b50_500_800_p2_trig_adc · axial · 5.0mm · 1.82mm/px · z∈[-7,+166]mm · 2 of 30 slices shown]
[im 1/30]
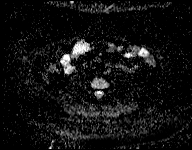
[im 30/30]
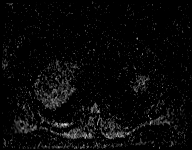

[Series 8: T2 · axial · 5.0mm · 1.37mm/px · 1 of 30 slices shown (3 of 3)]
[im 1/30]
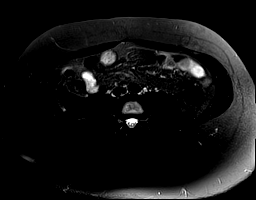

[Series 9: bSSFP · axial · 4.0mm · 0.68mm/px · z∈[-30,+141]mm · 2 of 44 slices shown]
[im 1/44]
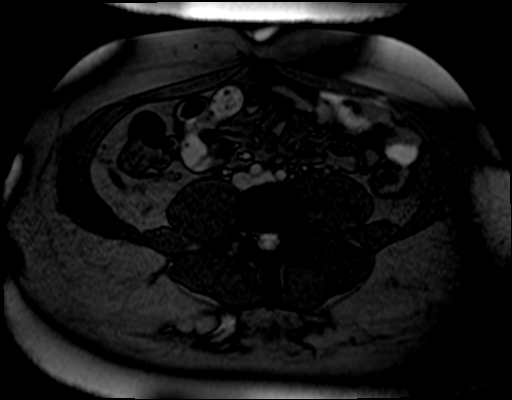
[im 44/44]
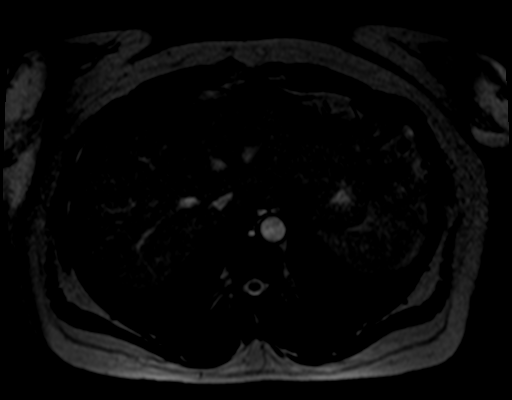

[Series 10: T1 dynamic · coronal · 2.5mm · 0.74mm/px · 3 of 60 slices shown (1 of 2)]
[im 1/60]
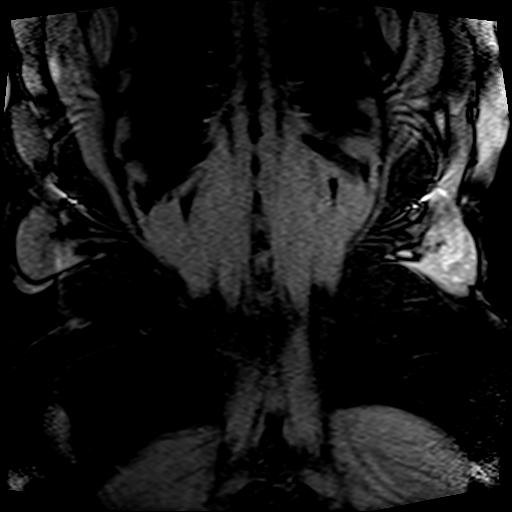
[im 30/60]
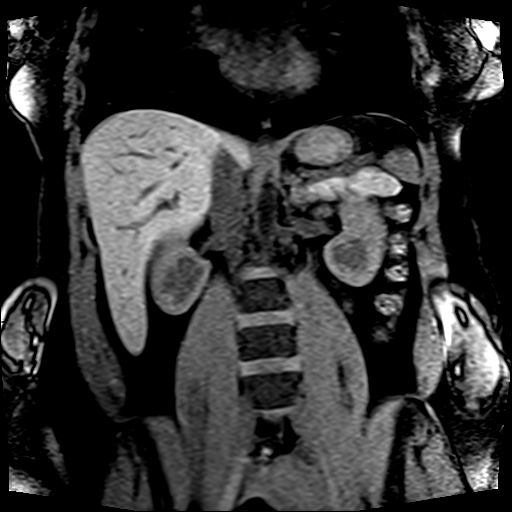
[im 60/60]
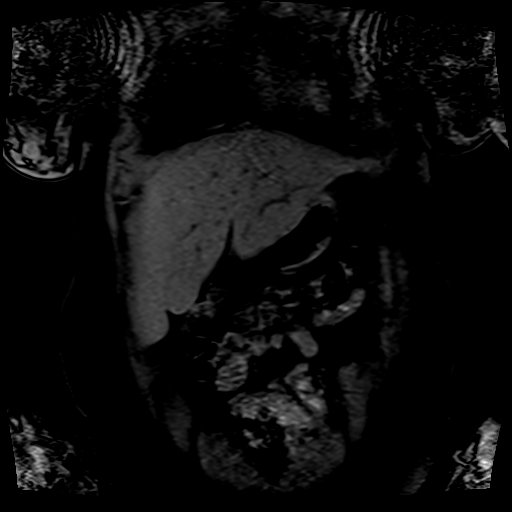

[Series 11: T1 · axial · 5.0mm · 0.68mm/px · z∈[-22,+133]mm · 2 of 54 slices shown]
[im 1/54]
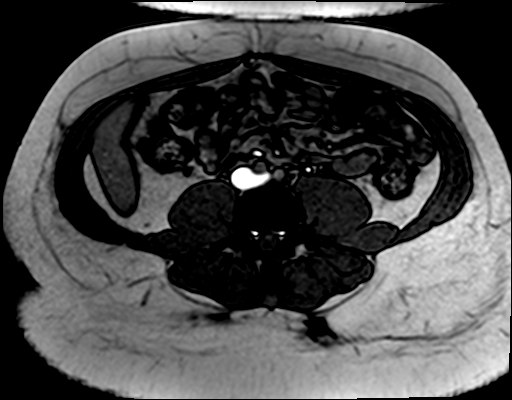
[im 54/54]
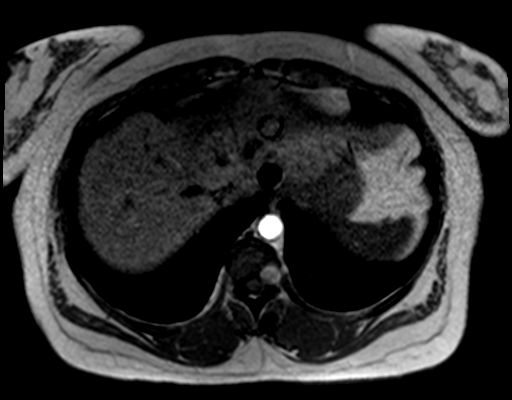

[Series 12: T1 dynamic · axial · non-contrast · 2.3mm · 1.37mm/px · z∈[-26,+137]mm · 3 of 72 slices shown (2 of 2)]
[im 1/72]
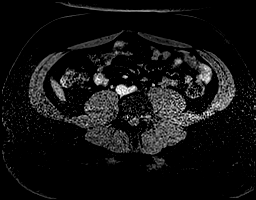
[im 36/72]
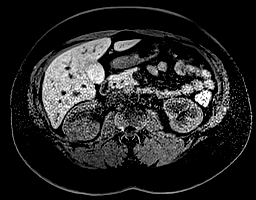
[im 72/72]
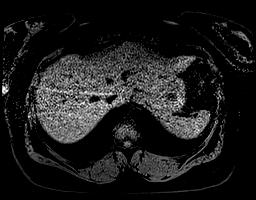

[Series 13: post 25 sec · axial · 2.3mm · 1.37mm/px · z∈[-26,+137]mm · 3 of 72 slices shown]
[im 1/72]
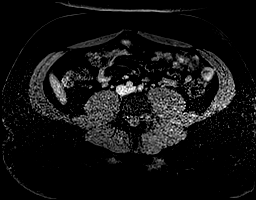
[im 36/72]
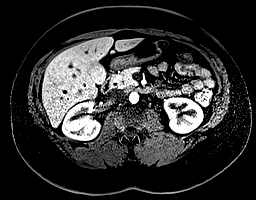
[im 72/72]
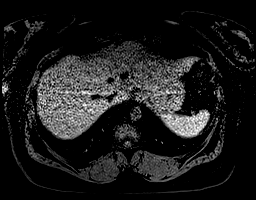

[Series 14: post 25 sec_sub · axial · 2.3mm · 1.37mm/px · z∈[-26,+137]mm · 3 of 72 slices shown]
[im 1/72]
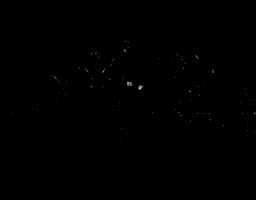
[im 36/72]
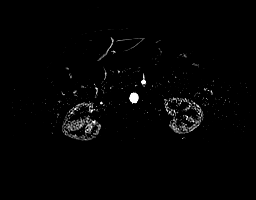
[im 72/72]
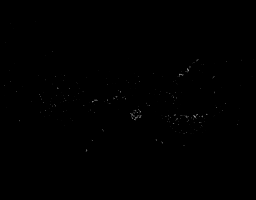

[Series 15: post 45 sec · axial · 2.3mm · 1.37mm/px · z∈[-26,+137]mm · 3 of 72 slices shown]
[im 1/72]
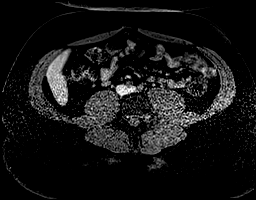
[im 36/72]
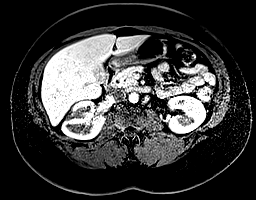
[im 72/72]
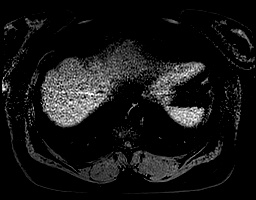

[Series 16: post 45 sec_sub · axial · 2.3mm · 1.37mm/px · z∈[-26,+137]mm · 3 of 72 slices shown]
[im 1/72]
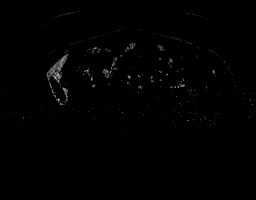
[im 36/72]
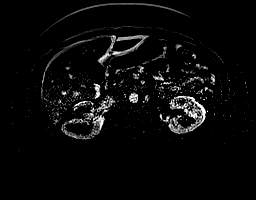
[im 72/72]
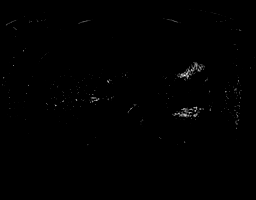

[Series 17: post 90 sec · axial · 2.3mm · 1.37mm/px · z∈[-26,+137]mm · 3 of 72 slices shown]
[im 1/72]
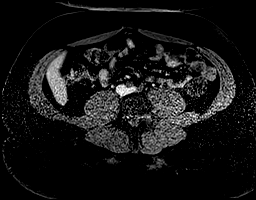
[im 36/72]
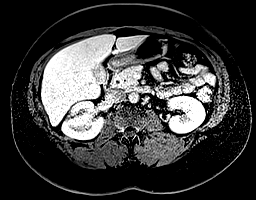
[im 72/72]
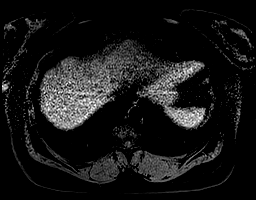

[Series 18: post 90 sec_sub · axial · 2.3mm · 1.37mm/px · z∈[-26,+137]mm · 3 of 72 slices shown]
[im 1/72]
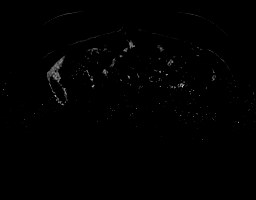
[im 36/72]
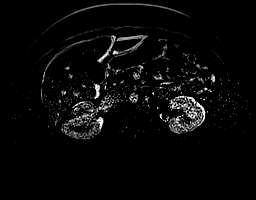
[im 72/72]
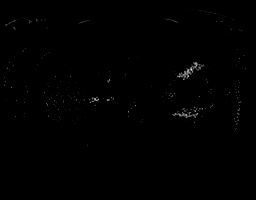

[Series 19: T1 dynamic post-contrast · coronal · 2.5mm · 0.74mm/px · 2 of 52 slices shown]
[im 1/52]
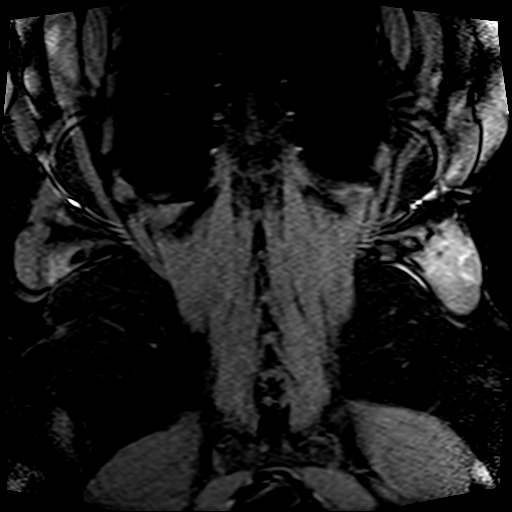
[im 52/52]
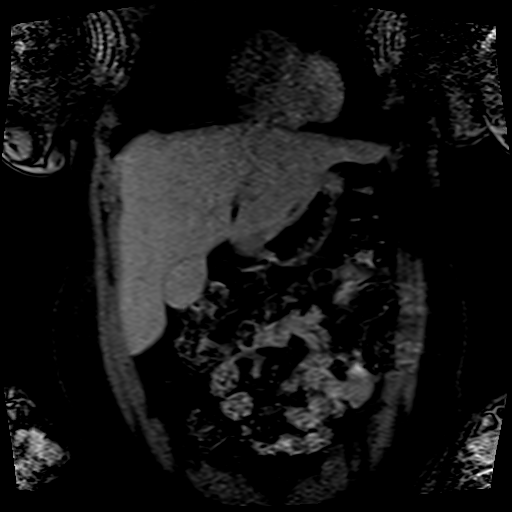

[Series 20: post axial 3+ · axial · 2.3mm · 1.37mm/px · z∈[-26,+137]mm · 3 of 72 slices shown]
[im 1/72]
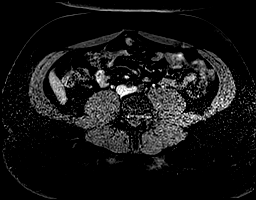
[im 36/72]
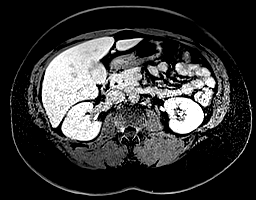
[im 72/72]
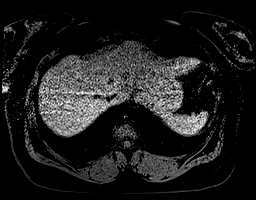

[45 of 48 positions shown; findings below may reference images not displayed]

FINDINGS: Lower chest: No acute abnormality.

Hepatobiliary: No hepatic steatosis. No suspicious hepatic lesion
visualized portions of the liver. Gallbladder is unremarkable. No
biliary ductal dilation.

Pancreas: Intrinsic T1 signal a pancreatic parenchyma is within
normal limits. No pancreatic ductal dilation. Homogeneous
enhancement of the pancreas postcontrast administration.

Spleen:  No splenomegaly or focal splenic lesion.

Adrenals/Urinary Tract:  Bilateral adrenal glands appear normal.

No hydronephrosis.

Postprocedural changes of right renal cryoablation with a 2.2 x
cm ablation defect in the interpolar left kidney on image [DATE],
without suspicious enhancing soft tissue nodularity.

Additional simple right renal cyst requires no further follow-up.

No solid enhancing renal mass

Stomach/Bowel: Visualized portions within the abdomen are
unremarkable.

Vascular/Lymphatic: No pathologically enlarged lymph nodes
identified. No abdominal aortic aneurysm demonstrated.

Other:  None.

Musculoskeletal: No suspicious bone lesions identified.
IMPRESSION: Postprocedural changes of right renal cryoablation without evidence
of residual disease or abdominal metastasis.

## 2021-11-29 MED ORDER — GADOBENATE DIMEGLUMINE 529 MG/ML IV SOLN
18.0000 mL | Freq: Once | INTRAVENOUS | Status: AC | PRN
  Administered 2021-11-29: 18 mL via INTRAVENOUS

## 2021-12-17 ENCOUNTER — Ambulatory Visit
Admission: RE | Admit: 2021-12-17 | Discharge: 2021-12-17 | Disposition: A | Discharge status: Home / Self Care | Payer: Managed Care, Other (non HMO) | Source: Ambulatory Visit | Attending: Interventional Radiology | Admitting: Interventional Radiology

## 2021-12-17 ENCOUNTER — Encounter: Payer: Self-pay | Admitting: *Deleted

## 2021-12-17 DIAGNOSIS — N2889 Other specified disorders of kidney and ureter: Secondary | ICD-10-CM

## 2021-12-17 HISTORY — PX: IR RADIOLOGIST EVAL & MGMT: IMG5224

## 2021-12-17 NOTE — Progress Notes (Signed)
Patient ID: Kelly Moreno, female   DOB: [birth year], 46 y.o.   MRN: [id number] ? ?    ? ? ?Chief Complaint: ?Indeterminate right sided renal lesion, post renal cryoablation performed 08/15/2021 ?  ?Referring Physician(s): ?Gay,Matthew R ?  ?History of Present Illness: ?Kelly Moreno is a 46 y.o. female with past medical history significant for seasonal allergies who is underwent technically successful right-sided renal cryoablation on 08/15/2021. She is seen in postprocedural telemedicine consultation following acquisition of abdominal MRI performed 11/29/2021. ? ?In review, the patient was found to have a worrisome right-sided renal lesion identified on abdominal CT performed 05/31/2021 for the evaluation of microscopic hematuria. The lesion was further evaluated on abdominal MRI performed 06/19/2021 demonstrated an approximately 1.3 x 1.1 cm enhancing lesion involving the mid lateral aspect of the right kidney with imaging characteristics worrisome for a renal cell carcinoma.  For this, the patient underwent right-sided renal cryoablation on 08/15/2021.  Note, biopsy was not performed at the time of the cryoablation given the small size of the renal lesion. ? ?Patient is without complaint regarding the right cryoablation site.  Specifically, no flank pain or hematuria. ? ?Past Medical History:  ?Diagnosis Date  ? Anemia   ? Seasonal allergies   ? ? ?Past Surgical History:  ?Procedure Laterality Date  ? IR RADIOLOGIST EVAL & MGMT  07/19/2021  ? IR RADIOLOGIST EVAL & MGMT  10/02/2021  ? NO PAST SURGERIES    ? RADIOLOGY WITH ANESTHESIA Right 08/15/2021  ? Procedure: IR WITH ANESTHESIA RENAL CRYOABLATION;  Surgeon: Sandi Mariscal, MD;  Location: [facility];  Service: Anesthesiology;  Laterality: Right;  ? ? ?Allergies: ?Patient has no known allergies. ? ?Medications: ?Prior to Admission medications   ?Medication Sig Start Date End Date Taking? Authorizing Provider  ?Acetaminophen-Caff-Pyrilamine (MIDOL COMPLETE) 500-60-15  MG TABS Take 1 tablet by mouth 3 (three) times daily as needed (menstrual pain.).    [provider]  ?calcipotriene-betamethasone (TACLONEX) ointment Apply 1 application topically once a week. Applied to affected area(s) of skin 06/17/21   [provider]  ?Clobetasol Propionate 0.05 % shampoo Apply 1 application topically once a week. 06/17/21   [provider]  ?docusate sodium (COLACE) 100 MG capsule Take 1 capsule (100 mg total) by mouth 2 (two) times daily as needed for mild constipation. 08/16/21   Han, Aimee H, PA-C  ?Fluocinolone Acetonide Body 0.01 % OIL Apply 1 application topically once a week. Applied to scalp 06/17/21   [provider]  ?fluticasone (FLONASE) 50 MCG/ACT nasal spray Place 1 spray into both nostrils every evening.    [provider]  ?ibuprofen (ADVIL) 200 MG tablet Take 200 mg by mouth every 8 (eight) hours as needed (menstrual pain.).    [provider]  ?JUNEL 1/20 1-20 MG-MCG tablet Take 1 tablet by mouth at bedtime. 06/17/21   [provider]  ?levocetirizine (XYZAL) 5 MG tablet Take 5 mg by mouth every evening.    [provider]  ?Multiple Vitamin (MULTIVITAMIN WITH MINERALS) TABS tablet Take 1 tablet by mouth every evening.    [provider]  ?Multiple Vitamin (MULTIVITAMIN) LIQD Take 15 mLs by mouth 2 (two) times a week. NutraBurst Liquid Multivitamin    [provider]  ?norgestimate-ethinyl estradiol (ORTHO-CYCLEN) 0.25-35 MG-MCG tablet Take 1 tablet by mouth at bedtime.    [provider]  ?Pseudoephedrine-Ibuprofen (ADVIL COLD/SINUS) 30-200 MG TABS Take 1 tablet by mouth daily as needed (cold symptoms).    [provider]  ?  valACYclovir (VALTREX) 500 MG tablet Take 500 mg by mouth 2 (two) times daily as needed (fever blisters/cold sores). 06/17/21   [provider]  ?  ? ?Family History  ?Problem Relation Age of Onset  ? High blood pressure Mother   ? High  Cholesterol Mother   ? High blood pressure Father   ? High Cholesterol Father   ? Osteoarthritis Maternal Grandmother   ? Cancer Maternal Grandmother   ?     blood; ended up turning to leukemia  ? Alcohol abuse Maternal Grandfather   ? Heart disease Maternal Grandfather   ? Osteoarthritis Paternal Grandmother   ? Kidney failure Paternal Grandmother   ? Lung cancer Paternal Grandfather   ?     smoker  ? ? ?Social History  ? ?Socioeconomic History  ? Marital status: Single  ?  Spouse name: Not on file  ? Number of children: Not on file  ? Years of education: Not on file  ? Highest education level: Not on file  ?Occupational History  ? Not on file  ?Tobacco Use  ? Smoking status: Never  ? Smokeless tobacco: Never  ?Substance and Sexual Activity  ? Alcohol use: Yes  ?  Alcohol/week: 4.0 standard drinks  ?  Types: 4 Standard drinks or equivalent per week  ? Drug use: Never  ? Sexual activity: Not on file  ?Other Topics Concern  ? Not on file  ?Social History Narrative  ? Not on file  ? ?Social Determinants of Health  ? ?Financial Resource Strain: Not on file  ?Food Insecurity: Not on file  ?Transportation Needs: Not on file  ?Physical Activity: Not on file  ?Stress: Not on file  ?Social Connections: Not on file  ? ? ?ECOG Status: ?0 - Asymptomatic ? ?Review of Systems ? ?Review of Systems: A 12 point ROS discussed and pertinent positives are indicated in the HPI above.  All other systems are negative. ? ?Physical Exam ?No direct physical exam was performed (except for noted visual exam findings with Video Visits).  ? ?Vital Signs: ?There were no vitals taken for this visit. ? ?Imaging: ? ?The following examinations were reviewed in detail: ? ?CT abdomen pelvis-05/31/2021 ?Abdominal MRI-06/19/2021; 11/29/2021 ?CT-guided right renal cryoablation-08/15/2021. ? ?Personal review of CT scan of the abdomen and pelvis performed 11/29/2021 demonstrates a technically excellent result without evidence of residual or locally recurrent  disease at the right renal cryoablation site.  No evidence of complication.  Specifically, no evidence of a urine leak or perinephric hematoma. ? ?MR ABDOMEN WWO CONTRAST ? ?Result Date: 11/29/2021 ?CLINICAL DATA:  History of right renal neoplasm status post cryoablation December 2022. EXAM: MRI ABDOMEN WITHOUT AND WITH CONTRAST TECHNIQUE: Multiplanar multisequence MR imaging of the abdomen was performed both before and after the administration of intravenous contrast. CONTRAST:  36m MULTIHANCE GADOBENATE DIMEGLUMINE 529 MG/ML IV SOLN COMPARISON:  MRI June 19, 2021 FINDINGS: Lower chest: No acute abnormality. Hepatobiliary: No hepatic steatosis. No suspicious hepatic lesion visualized portions of the liver. Gallbladder is unremarkable. No biliary ductal dilation. Pancreas: Intrinsic T1 signal a pancreatic parenchyma is within normal limits. No pancreatic ductal dilation. Homogeneous enhancement of the pancreas postcontrast administration. Spleen:  No splenomegaly or focal splenic lesion. Adrenals/Urinary Tract:  Bilateral adrenal glands appear normal. No hydronephrosis. Postprocedural changes of right renal cryoablation with a 2.2 x 0.7 cm ablation defect in the interpolar left kidney on image 31/3, without suspicious enhancing soft tissue nodularity. Additional simple right renal cyst requires no further follow-up.  No solid enhancing renal mass Stomach/Bowel: Visualized portions within the abdomen are unremarkable. Vascular/Lymphatic: No pathologically enlarged lymph nodes identified. No abdominal aortic aneurysm demonstrated. Other:  None. Musculoskeletal: No suspicious bone lesions identified. IMPRESSION: Postprocedural changes of right renal cryoablation without evidence of residual disease or abdominal metastasis. Electronically Signed   By: Dahlia Bailiff M.D.   On: 11/29/2021 14:41   ? ?Labs: ? ?CBC: ?Recent Labs  ?  08/09/21 ?9357 08/15/21 ?1045  ?WBC 7.3 8.8  ?HGB 13.1 13.4  ?HCT 40.4 42.3  ?PLT 294 250   ? ? ?COAGS: ?Recent Labs  ?  08/15/21 ?1045  ?INR 1.0  ? ? ?BMP: ?Recent Labs  ?  08/09/21 ?0177 08/15/21 ?1045 09/28/21 ?1646  ?NA 136 134* 138  ?K 3.9 4.4 4.0  ?CL 102 103 101  ?CO2 28 21* 26  ?GLUCOS

## 2022-02-08 ENCOUNTER — Other Ambulatory Visit: Payer: Self-pay | Admitting: Interventional Radiology

## 2022-02-08 DIAGNOSIS — N2889 Other specified disorders of kidney and ureter: Secondary | ICD-10-CM

## 2022-02-13 ENCOUNTER — Other Ambulatory Visit: Payer: Self-pay | Admitting: Urology

## 2022-02-13 DIAGNOSIS — D49511 Neoplasm of unspecified behavior of right kidney: Secondary | ICD-10-CM

## 2022-02-13 DIAGNOSIS — N2889 Other specified disorders of kidney and ureter: Secondary | ICD-10-CM

## 2022-03-22 ENCOUNTER — Ambulatory Visit
Admission: RE | Admit: 2022-03-22 | Discharge: 2022-03-22 | Disposition: A | Discharge status: Home / Self Care | Payer: Managed Care, Other (non HMO) | Source: Ambulatory Visit | Attending: Urology | Admitting: Urology

## 2022-03-22 DIAGNOSIS — N2889 Other specified disorders of kidney and ureter: Secondary | ICD-10-CM

## 2022-03-22 DIAGNOSIS — D49511 Neoplasm of unspecified behavior of right kidney: Secondary | ICD-10-CM

## 2022-03-22 MED ORDER — GADOBENATE DIMEGLUMINE 529 MG/ML IV SOLN
18.0000 mL | Freq: Once | INTRAVENOUS | Status: AC | PRN
  Administered 2022-03-22: 18 mL via INTRAVENOUS

## 2022-03-25 ENCOUNTER — Ambulatory Visit
Admission: RE | Admit: 2022-03-25 | Discharge: 2022-03-25 | Disposition: A | Discharge status: Home / Self Care | Payer: Managed Care, Other (non HMO) | Source: Ambulatory Visit | Attending: Interventional Radiology | Admitting: Interventional Radiology

## 2022-03-25 DIAGNOSIS — N2889 Other specified disorders of kidney and ureter: Secondary | ICD-10-CM

## 2022-03-25 HISTORY — PX: IR RADIOLOGIST EVAL & MGMT: IMG5224

## 2022-03-25 NOTE — Progress Notes (Signed)
Patient ID: Kelly Moreno, female   DOB: 05/03/1976, 47 y.o.   MRN: [id number]        Chief Complaint: Indeterminate right sided renal lesion, post renal cryoablation performed 08/15/2021   Referring Physician(s): Gay,Matthew R   History of Present Illness: Kelly Moreno is a 46 y.o. female with past medical history significant for seasonal allergies who is underwent technically successful right-sided renal cryoablation on 08/15/2021. She is seen in postprocedural telemedicine consultation following acquisition of most recent post procedural abdominal MRI performed 03/22/2022.  In review, the patient was found to have a worrisome right-sided renal lesion identified on abdominal CT performed 05/31/2021 for the evaluation of microscopic hematuria. The lesion was further evaluated on abdominal MRI performed 06/19/2021 demonstrated an approximately 1.3 x 1.1 cm enhancing lesion involving the mid lateral aspect of the right kidney with imaging characteristics worrisome for a renal cell carcinoma.  For this, the patient underwent right-sided renal cryoablation on 08/15/2021.  Note, biopsy was not performed at the time of the cryoablation given the small size of the renal lesion.  Patient is without complaint regarding the right cryoablation site.  Specifically, no flank pain or hematuria.  Past Medical History:  Diagnosis Date   Anemia    Seasonal allergies     Past Surgical History:  Procedure Laterality Date   IR RADIOLOGIST EVAL & MGMT  07/19/2021   IR RADIOLOGIST EVAL & MGMT  10/02/2021   IR RADIOLOGIST EVAL & MGMT  12/17/2021   NO PAST SURGERIES     RADIOLOGY WITH ANESTHESIA Right 08/15/2021   Procedure: IR WITH ANESTHESIA RENAL CRYOABLATION;  Surgeon: Sandi Mariscal, MD;  Location: [facility];  Service: Anesthesiology;  Laterality: Right;    Allergies: Patient has no known allergies.  Medications: Prior to Admission medications   Medication Sig Start Date End Date Taking? Authorizing  Provider  Acetaminophen-Caff-Pyrilamine (MIDOL COMPLETE) 500-60-15 MG TABS Take 1 tablet by mouth 3 (three) times daily as needed (menstrual pain.).    [provider]  calcipotriene-betamethasone (TACLONEX) ointment Apply 1 application topically once a week. Applied to affected area(s) of skin 06/17/21   [provider]  Clobetasol Propionate 0.05 % shampoo Apply 1 application topically once a week. 06/17/21   [provider]  docusate sodium (COLACE) 100 MG capsule Take 1 capsule (100 mg total) by mouth 2 (two) times daily as needed for mild constipation. 08/16/21   Han, Aimee H, PA-C  Fluocinolone Acetonide Body 0.01 % OIL Apply 1 application topically once a week. Applied to scalp 06/17/21   [provider]  fluticasone (FLONASE) 50 MCG/ACT nasal spray Place 1 spray into both nostrils every evening.    [provider]  ibuprofen (ADVIL) 200 MG tablet Take 200 mg by mouth every 8 (eight) hours as needed (menstrual pain.).    [provider]  JUNEL 1/20 1-20 MG-MCG tablet Take 1 tablet by mouth at bedtime. 06/17/21   [provider]  levocetirizine (XYZAL) 5 MG tablet Take 5 mg by mouth every evening.    [provider]  Multiple Vitamin (MULTIVITAMIN WITH MINERALS) TABS tablet Take 1 tablet by mouth every evening.    [provider]  Multiple Vitamin (MULTIVITAMIN) LIQD Take 15 mLs by mouth 2 (two) times a week. NutraBurst Liquid Multivitamin    [provider]  norgestimate-ethinyl estradiol (ORTHO-CYCLEN) 0.25-35 MG-MCG tablet Take 1 tablet by mouth at bedtime.    [provider]  Pseudoephedrine-Ibuprofen (ADVIL COLD/SINUS) 30-200 MG TABS Take 1 tablet  by mouth daily as needed (cold symptoms).    [provider]  valACYclovir (VALTREX) 500 MG tablet Take 500 mg by mouth 2 (two) times daily as needed (fever blisters/cold sores). 06/17/21   [provider]     Family History   Problem Relation Age of Onset   High blood pressure Mother    High Cholesterol Mother    High blood pressure Father    High Cholesterol Father    Osteoarthritis Maternal Grandmother    Cancer Maternal Grandmother        blood; ended up turning to leukemia   Alcohol abuse Maternal Grandfather    Heart disease Maternal Grandfather    Osteoarthritis Paternal Grandmother    Kidney failure Paternal Grandmother    Lung cancer Paternal Grandfather        smoker    Social History   Socioeconomic History   Marital status: Single    Spouse name: Not on file   Number of children: Not on file   Years of education: Not on file   Highest education level: Not on file  Occupational History   Not on file  Tobacco Use   Smoking status: Never   Smokeless tobacco: Never  Substance and Sexual Activity   Alcohol use: Yes    Alcohol/week: 4.0 standard drinks of alcohol    Types: 4 Standard drinks or equivalent per week   Drug use: Never   Sexual activity: Not on file  Other Topics Concern   Not on file  Social History Narrative   Not on file   Social Determinants of Health   Financial Resource Strain: Not on file  Food Insecurity: Not on file  Transportation Needs: Not on file  Physical Activity: Not on file  Stress: Not on file  Social Connections: Not on file    ECOG Status: 0 - Asymptomatic  Review of Systems  Review of Systems: A 12 point ROS discussed and pertinent positives are indicated in the HPI above.  All other systems are negative.  Physical Exam No direct physical exam was performed (except for noted visual exam findings with Video Visits).   Vital Signs: There were no vitals taken for this visit.  Imaging: MR ABDOMEN WWO CONTRAST  Result Date: 03/22/2022 CLINICAL DATA:  Renal mass status post ablation, follow-up EXAM: MRI ABDOMEN WITHOUT AND WITH CONTRAST TECHNIQUE: Multiplanar multisequence MR imaging of the abdomen was performed both before and after the  administration of intravenous contrast. CONTRAST:  41m MULTIHANCE GADOBENATE DIMEGLUMINE 529 MG/ML IV SOLN COMPARISON:  MRI abdomen 11/29/2021 FINDINGS: Lower chest: Trace bilateral pleural effusions. Hepatobiliary: Liver is enlarged measuring 18.8 cm in length. No hepatic mass identified. Gallbladder appears normal. No biliary ductal dilatation. Pancreas: No mass, inflammatory changes, or other parenchymal abnormality identified. Spleen:  Within normal limits in size and appearance. Adrenals/Urinary Tract: Adrenal glands are normal. Previous cryoablation defect again seen in the lateral midpole right kidney measuring 2.2 x 0.7 cm, with no new suspicious enhancement. Two right renal cysts measuring up to 1.4 cm in the upper pole which contains a small amount of layering dependent hemorrhagic/proteinaceous material. No left renal lesions identified. No hydronephrosis identified bilaterally. Stomach/Bowel: Visualized portions within the abdomen are unremarkable. Vascular/Lymphatic: No pathologically enlarged lymph nodes identified. No abdominal aortic aneurysm demonstrated. Other:  No ascites. Musculoskeletal: No suspicious bone lesions identified. IMPRESSION: 1. Previous cryoablation changes in the right lateral kidney with no evidence to suggest residual/recurrent disease. 2. No lymphadenopathy. 3. Trace bilateral pleural effusions.  4. Hepatomegaly. 5. Right renal cysts including a cyst containing a small amount hemorrhagic/proteinaceous material. Electronically Signed   By: Ofilia Neas M.D.   On: 03/22/2022 11:27    Labs:  CBC: Recent Labs    08/09/21 0814 08/15/21 1045  WBC 7.3 8.8  HGB 13.1 13.4  HCT 40.4 42.3  PLT 294 250    COAGS: Recent Labs    08/15/21 1045  INR 1.0    BMP: Recent Labs    08/09/21 0814 08/15/21 1045 09/28/21 1646  NA 136 134* 138  K 3.9 4.4 4.0  CL 102 103 101  CO2 28 21* 26  GLUCOSE 86 77 82  BUN '18 17 15  '$ CALCIUM 9.0 9.2 9.1  CREATININE 0.87 0.78  0.81  GFRNONAA >60 >60  --     LIVER FUNCTION TESTS: No results for input(s): "BILITOT", "AST", "ALT", "ALKPHOS", "PROT", "ALBUMIN" in the last 8760 hours.  TUMOR MARKERS: No results for input(s): "AFPTM", "CEA", "CA199", "CHROMGRNA" in the last 8760 hours.  Assessment and Plan:  Kelly Moreno is a 46 y.o. female with past medical history significant for seasonal allergies who is underwent technically successful right-sided renal cryoablation on 08/15/2021. She is seen in postprocedural telemedicine consultation following acquisition of abdominal MRI performed 11/29/2021.   The following examinations were reviewed in detail: CT abdomen and pelvis - 05/31/2021 Abdominal MRI - 06/19/2021; 11/29/2021; 03/25/2022 CT-guided right renal cryoablation - 08/15/2021.  Personal review of abdominal MRI performed 03/22/2022 demonstrates a technically excellent result without evidence of residual or locally recurrent disease.    This examination documents 7 months of stability.  Patient has tolerated the abdominal MRI well and given her young age, we will proceed with acquisition of next surveillance abdominal MRI in 3 months (October/November 2023).  (Every 3 months for the first year followed by every 6 months for the following year and a final surveillance examination performed 3 years following the cryoablation).  The patient knows to call the interventional radiology clinic with any interval questions or concerns.  Plan: - Follow-up telemedicine consultation following acquisition of next surveillance abdominal MRI in October/November 2023.  Thank you for this interesting consult.  I greatly enjoyed meeting Kelly Moreno and look forward to participating in their care.  A copy of this report was sent to the requesting provider on this date.  Electronically Signed: Sandi Mariscal 03/25/2022, 9:43 AM   I spent a total of 10 Minutes in remote  clinical consultation, greater than 50% of which was  counseling/coordinating care for post right renal cryoablation.    Visit type: Audio only (telephone). Audio (no video) only due to patient's lack of internet/smartphone capability. Alternative for in-person consultation at [facility], [place] [address] [facility], [place], [place]. This visit type was conducted due to national recommendations for restrictions regarding the COVID-19 Pandemic (e.g. social distancing).  This format is felt to be most appropriate for this patient at this time.  All issues noted in this document were discussed and addressed.

## 2022-05-10 ENCOUNTER — Other Ambulatory Visit: Payer: Self-pay | Admitting: Urology

## 2022-05-10 DIAGNOSIS — D49511 Neoplasm of unspecified behavior of right kidney: Secondary | ICD-10-CM

## 2022-05-16 ENCOUNTER — Other Ambulatory Visit: Payer: Self-pay | Admitting: Interventional Radiology

## 2022-05-16 DIAGNOSIS — N2889 Other specified disorders of kidney and ureter: Secondary | ICD-10-CM

## 2022-06-28 ENCOUNTER — Other Ambulatory Visit: Payer: Managed Care, Other (non HMO)

## 2022-07-04 ENCOUNTER — Ambulatory Visit
Admission: RE | Admit: 2022-07-04 | Discharge: 2022-07-04 | Disposition: A | Discharge status: Home / Self Care | Payer: Managed Care, Other (non HMO) | Source: Ambulatory Visit | Attending: Interventional Radiology | Admitting: Interventional Radiology

## 2022-07-04 ENCOUNTER — Other Ambulatory Visit: Payer: Self-pay | Admitting: Interventional Radiology

## 2022-07-04 DIAGNOSIS — N2889 Other specified disorders of kidney and ureter: Secondary | ICD-10-CM

## 2022-07-04 DIAGNOSIS — D49511 Neoplasm of unspecified behavior of right kidney: Secondary | ICD-10-CM

## 2022-08-16 ENCOUNTER — Other Ambulatory Visit: Payer: Managed Care, Other (non HMO)

## 2023-04-07 DIAGNOSIS — D49511 Neoplasm of unspecified behavior of right kidney: Secondary | ICD-10-CM | POA: Diagnosis not present

## 2023-04-14 DIAGNOSIS — R310 Gross hematuria: Secondary | ICD-10-CM | POA: Diagnosis not present

## 2023-04-14 DIAGNOSIS — D49511 Neoplasm of unspecified behavior of right kidney: Secondary | ICD-10-CM | POA: Diagnosis not present

## 2023-04-21 ENCOUNTER — Ambulatory Visit (INDEPENDENT_AMBULATORY_CARE_PROVIDER_SITE_OTHER): Payer: Medicaid Other | Admitting: Family Medicine

## 2023-04-21 ENCOUNTER — Encounter: Payer: Self-pay | Admitting: Family Medicine

## 2023-04-21 VITALS — BP 115/70 | HR 87 | Temp 99.0°F | Ht 62.0 in | Wt 196.0 lb

## 2023-04-21 DIAGNOSIS — Z0001 Encounter for general adult medical examination with abnormal findings: Secondary | ICD-10-CM

## 2023-04-21 DIAGNOSIS — W540XXA Bitten by dog, initial encounter: Secondary | ICD-10-CM | POA: Diagnosis not present

## 2023-04-21 DIAGNOSIS — E669 Obesity, unspecified: Secondary | ICD-10-CM | POA: Diagnosis not present

## 2023-04-21 DIAGNOSIS — Z23 Encounter for immunization: Secondary | ICD-10-CM | POA: Diagnosis not present

## 2023-04-21 DIAGNOSIS — Z1211 Encounter for screening for malignant neoplasm of colon: Secondary | ICD-10-CM

## 2023-04-21 DIAGNOSIS — Z1159 Encounter for screening for other viral diseases: Secondary | ICD-10-CM

## 2023-04-21 DIAGNOSIS — Z0000 Encounter for general adult medical examination without abnormal findings: Secondary | ICD-10-CM | POA: Insufficient documentation

## 2023-04-21 DIAGNOSIS — L409 Psoriasis, unspecified: Secondary | ICD-10-CM | POA: Insufficient documentation

## 2023-04-21 DIAGNOSIS — S61259A Open bite of unspecified finger without damage to nail, initial encounter: Secondary | ICD-10-CM

## 2023-04-21 MED ORDER — AMOXICILLIN-POT CLAVULANATE 875-125 MG PO TABS: 1.0000 | ORAL_TABLET | Freq: Two times a day (BID) | ORAL | 0 refills | Status: AC

## 2023-04-21 MED ORDER — CALCIPOTRIENE-BETAMETH DIPROP 0.005-0.064 % EX OINT: 1.0000 | TOPICAL_OINTMENT | CUTANEOUS | 0 refills | Status: DC

## 2023-04-21 MED ORDER — CLOBETASOL PROPIONATE 0.05 % EX SHAM: 1.0000 "application " | MEDICATED_SHAMPOO | CUTANEOUS | 1 refills | Status: DC

## 2023-04-21 MED ORDER — FLUOCINOLONE ACETONIDE BODY 0.01 % EX OIL: 1.0000 "application " | TOPICAL_OIL | CUTANEOUS | 1 refills | Status: DC

## 2023-04-21 NOTE — Assessment & Plan Note (Signed)
Counseled on importance of reducing calories to less than 1500 calories daily and continuing exercise. Increase exercise to 5 days weekly and incorporate strength training. Please let us know if you gain employment with an employer that covers medications. Referral placed to [facility].

## 2023-04-21 NOTE — Progress Notes (Addendum)
New Patient Office Visit  Subjective    Patient ID: Kelly Moreno, female    DOB: Feb 22, 1976  Age: 47 y.o. MRN: [id number]  CC:  Chief Complaint  Patient presents with   Establish Care    Plus would like for you to look at finger due pt's dog bit her    HPI Kelly Moreno presents to establish care. Oriented to practice routines and expectations. Has not been seeing a PCP for some time.  PMH includes kidney tumor that she has had an ablation for and is followed by Urology. She also GYN for routine women's care.  She is interested in weight management. She has tried phentermine? in past, she discontinued this due to hair breakage. She currently restricts calories using weight watchers and ~1500 calories daily. She exercises doing 30-1h elliptical and walking 3-4 days weekly.  Breast CA screening: annually, normal Cervical CA screening: approximate date 06/2022 and was normal Colon CA screening: never screened, will order cologuard Tobacco: non-smoker STI: declines, OK with Hep C today, HIV in past Vaccines: Tdap today    Outpatient Encounter Medications as of 04/21/2023  Medication Sig   Acetaminophen-Caff-Pyrilamine (MIDOL COMPLETE) 500-60-15 MG TABS Take 1 tablet by mouth 3 (three) times daily as needed (menstrual pain.).   amoxicillin-clavulanate (AUGMENTIN) 875-125 MG tablet Take 1 tablet by mouth 2 (two) times daily.   docusate sodium (COLACE) 100 MG capsule Take 1 capsule (100 mg total) by mouth 2 (two) times daily as needed for mild constipation.   fluticasone (FLONASE) 50 MCG/ACT nasal spray Place 1 spray into both nostrils every evening.   ibuprofen (ADVIL) 200 MG tablet Take 200 mg by mouth every 8 (eight) hours as needed (menstrual pain.).   levocetirizine (XYZAL) 5 MG tablet Take 5 mg by mouth every evening.   Multiple Vitamin (MULTIVITAMIN WITH MINERALS) TABS tablet Take 1 tablet by mouth every evening.   Multiple Vitamin (MULTIVITAMIN) LIQD Take 15 mLs by mouth  2 (two) times a week. NutraBurst Liquid Multivitamin   Pseudoephedrine-Ibuprofen (ADVIL COLD/SINUS) 30-200 MG TABS Take 1 tablet by mouth daily as needed (cold symptoms).   valACYclovir (VALTREX) 500 MG tablet Take 500 mg by mouth 2 (two) times daily as needed (fever blisters/cold sores).   [DISCONTINUED] calcipotriene-betamethasone (TACLONEX) ointment Apply 1 application topically once a week. Applied to affected area(s) of skin   [DISCONTINUED] Clobetasol Propionate 0.05 % shampoo Apply 1 application topically once a week.   [DISCONTINUED] Fluocinolone Acetonide Body 0.01 % OIL Apply 1 application topically once a week. Applied to scalp   calcipotriene-betamethasone (TACLONEX) ointment Apply 1 Application topically once a week. Applied to affected area(s) of skin   Clobetasol Propionate 0.05 % shampoo Apply 1 application  topically once a week.   Fluocinolone Acetonide Body 0.01 % OIL Apply 1 application  topically once a week. Applied to scalp   JUNEL 1/20 1-20 MG-MCG tablet Take 1 tablet by mouth at bedtime. (Patient not taking: Reported on 04/21/2023)   norgestimate-ethinyl estradiol (ORTHO-CYCLEN) 0.25-35 MG-MCG tablet Take 1 tablet by mouth at bedtime. (Patient not taking: Reported on 04/21/2023)   No facility-administered encounter medications on file as of 04/21/2023.    Past Medical History:  Diagnosis Date   Anemia    Seasonal allergies     Past Surgical History:  Procedure Laterality Date   ABLATION  2022   IR RADIOLOGIST EVAL & MGMT  07/19/2021   IR RADIOLOGIST EVAL & MGMT  10/02/2021   IR RADIOLOGIST EVAL & MGMT  12/17/2021   IR RADIOLOGIST EVAL & MGMT  03/25/2022   NO PAST SURGERIES     RADIOLOGY WITH ANESTHESIA Right 08/15/2021   Procedure: IR WITH ANESTHESIA RENAL CRYOABLATION;  Surgeon: Simonne Come, MD;  Location: [facility];  Service: Anesthesiology;  Laterality: Right;    Family History  Problem Relation Age of Onset   High blood pressure Mother    High Cholesterol  Mother    High blood pressure Father    High Cholesterol Father    Osteoarthritis Maternal Grandmother    Cancer Maternal Grandmother        blood; ended up turning to leukemia   Alcohol abuse Maternal Grandfather    Heart disease Maternal Grandfather    Osteoarthritis Paternal Grandmother    Kidney failure Paternal Grandmother    Lung cancer Paternal Grandfather        smoker    Social History   Socioeconomic History   Marital status: Single    Spouse name: Not on file   Number of children: Not on file   Years of education: Not on file   Highest education level: Not on file  Occupational History   Not on file  Tobacco Use   Smoking status: Never   Smokeless tobacco: Never  Substance and Sexual Activity   Alcohol use: Yes    Alcohol/week: 4.0 standard drinks of alcohol    Types: 4 Standard drinks or equivalent per week   Drug use: Never   Sexual activity: Not on file  Other Topics Concern   Not on file  Social History Narrative   Not on file   Social Determinants of Health   Financial Resource Strain: Not on file  Food Insecurity: Not on file  Transportation Needs: Not on file  Physical Activity: Not on file  Stress: Not on file  Social Connections: Not on file  Intimate Partner Violence: Not on file    Review of Systems  Constitutional: Negative.   HENT: Negative.    Eyes: Negative.   Respiratory: Negative.    Cardiovascular: Negative.   Gastrointestinal: Negative.   Genitourinary: Negative.   Musculoskeletal: Negative.   Skin: Negative.        Psoriasis and flaking of skin  Neurological: Negative.   Endo/Heme/Allergies: Negative.   Psychiatric/Behavioral: Negative.    All other systems reviewed and are negative.       Objective    BP 115/70   Pulse 87   Temp 99 F (37.2 C) (Oral)   Ht 5\' 2"  (1.575 m)   Wt 196 lb (88.9 kg)   LMP 04/03/2023 (Approximate)   SpO2 98%   BMI 35.85 kg/m   Physical Exam Vitals and nursing note reviewed.   Constitutional:      Appearance: Normal appearance. She is obese.  HENT:     Head: Normocephalic and atraumatic.     Right Ear: Tympanic membrane, ear canal and external ear normal.     Left Ear: Tympanic membrane, ear canal and external ear normal.     Nose: Nose normal.     Mouth/Throat:     Mouth: Mucous membranes are moist.     Pharynx: Oropharynx is clear.  Eyes:     Extraocular Movements: Extraocular movements intact.     Conjunctiva/sclera: Conjunctivae normal.     Pupils: Pupils are equal, round, and reactive to light.  Cardiovascular:     Rate and Rhythm: Normal rate and regular rhythm.     Pulses: Normal pulses.  Heart sounds: Normal heart sounds.  Pulmonary:     Effort: Pulmonary effort is normal.     Breath sounds: Normal breath sounds.  Abdominal:     General: Bowel sounds are normal.     Palpations: Abdomen is soft.  Musculoskeletal:        General: Normal range of motion.     Cervical back: Normal range of motion and neck supple.  Skin:    General: Skin is warm and dry.     Capillary Refill: Capillary refill takes less than 2 seconds.     Findings: Erythema and wound present.     Comments: Left first finger  Neurological:     General: No focal deficit present.     Mental Status: She is alert and oriented to person, place, and time. Mental status is at baseline.  Psychiatric:        Mood and Affect: Mood normal.        Behavior: Behavior normal.        Thought Content: Thought content normal.        Judgment: Judgment normal.         Assessment & Plan:   Problem List Items Addressed This Visit     Physical exam, annual - Primary    Today your medical history was reviewed and routine physical exam with labs was performed. Recommend 150 minutes of moderate intensity exercise weekly and consuming a well-balanced diet. Advised to stop smoking if a smoker, avoid smoking if a non-smoker, limit alcohol consumption to 1 drink per day for women and 2  drinks per day for men, and avoid illicit drug use. Counseled on safe sex practices and offered STI testing today. Counseled on the importance of sunscreen use. Counseled in mental health awareness and when to seek medical care. Vaccine maintenance discussed. Appropriate health maintenance items reviewed. Return to office in 1 year for annual physical exam.       Relevant Orders   CBC with Differential/Platelet (Completed)   COMPLETE METABOLIC PANEL WITH GFR (Completed)   Lipid panel (Completed)   TSH (Completed)   Hemoglobin A1c (Completed)   Obesity (BMI 35.0-39.9 without comorbidity)    Counseled on importance of reducing calories to less than 1500 calories daily and continuing exercise. Increase exercise to 5 days weekly and incorporate strength training. Please let us know if you gain employment with an employer that covers medications. Referral placed to [facility].      Relevant Orders   Amb Ref to Medical Weight Management   Hemoglobin A1c (Completed)   Psoriasis    Will reorder current medication regimen and refer to dermatology as this has been an ongoing uncontrolled problem for her.      Relevant Orders   Ambulatory referral to Dermatology   Open wound of finger of left hand due to dog bite    Erythematous and edematous wound to left first finger with scant purulent drainage. Start Augmentin 875-125mg  BID for 5 days.      Other Visit Diagnoses     Colon cancer screening       Relevant Orders   Cologuard   Need for hepatitis C screening test       Relevant Orders   Hepatitis C antibody (Completed)   Need for vaccination       Relevant Orders   Tdap vaccine greater than or equal to 7yo IM (Completed)       Return in about 1 year (around 04/20/2024) for annual physical  with labs 1 week prior.   Park Meo, FNP

## 2023-04-21 NOTE — Assessment & Plan Note (Addendum)
Erythematous and edematous wound to left first finger with scant purulent drainage. Start Augmentin 875-125mg  BID for 5 days.

## 2023-04-21 NOTE — Assessment & Plan Note (Signed)
Will reorder current medication regimen and refer to dermatology as this has been an ongoing uncontrolled problem for her.

## 2023-04-21 NOTE — Patient Instructions (Signed)
It was great to meet you today and I'm excited to have you join the [facility] practice. I hope you had a positive experience today! If you feel so inclined, please feel free to recommend our practice to friends and family. Mila Merry, FNP-C

## 2023-04-21 NOTE — Assessment & Plan Note (Signed)

## 2023-04-22 ENCOUNTER — Other Ambulatory Visit: Payer: Medicaid Other

## 2023-04-22 DIAGNOSIS — Z0000 Encounter for general adult medical examination without abnormal findings: Secondary | ICD-10-CM | POA: Diagnosis not present

## 2023-04-22 DIAGNOSIS — E785 Hyperlipidemia, unspecified: Secondary | ICD-10-CM | POA: Diagnosis not present

## 2023-04-22 DIAGNOSIS — E669 Obesity, unspecified: Secondary | ICD-10-CM | POA: Diagnosis not present

## 2023-04-22 DIAGNOSIS — Z1159 Encounter for screening for other viral diseases: Secondary | ICD-10-CM | POA: Diagnosis not present

## 2023-04-22 NOTE — Addendum Note (Signed)
Addended by: Park Meo on: 04/22/2023 11:28 AM   Modules accepted: Level of Service

## 2023-04-23 LAB — COMPLETE METABOLIC PANEL WITH GFR
AG Ratio: 1.3 (calc) (ref 1.0–2.5)
ALT: 22 U/L (ref 6–29)
AST: 16 U/L (ref 10–35)
Albumin: 4.4 g/dL (ref 3.6–5.1)
Alkaline phosphatase (APISO): 75 U/L (ref 31–125)
BUN: 16 mg/dL (ref 7–25)
CO2: 26 mmol/L (ref 20–32)
Calcium: 9.9 mg/dL (ref 8.6–10.2)
Chloride: 103 mmol/L (ref 98–110)
Creat: 0.92 mg/dL (ref 0.50–0.99)
Globulin: 3.5 g/dL (ref 1.9–3.7)
Glucose, Bld: 82 mg/dL (ref 65–99)
Potassium: 4.5 mmol/L (ref 3.5–5.3)
Sodium: 139 mmol/L (ref 135–146)
Total Bilirubin: 0.3 mg/dL (ref 0.2–1.2)
Total Protein: 7.9 g/dL (ref 6.1–8.1)
eGFR: 78 mL/min/{1.73_m2} (ref >=60)

## 2023-04-23 LAB — HEPATITIS C ANTIBODY: Hepatitis C Ab: NONREACTIVE

## 2023-04-23 LAB — HEMOGLOBIN A1C
Hgb A1c MFr Bld: 5.6 %{Hb} (ref <5.7)
Mean Plasma Glucose: 114 mg/dL
eAG (mmol/L): 6.3 mmol/L

## 2023-04-23 LAB — CBC WITH DIFFERENTIAL/PLATELET
Absolute Monocytes: 548 {cells}/uL (ref 200–950)
Basophils Absolute: 17 {cells}/uL (ref 0–200)
Basophils Relative: 0.2 %
Eosinophils Absolute: 100 {cells}/uL (ref 15–500)
Eosinophils Relative: 1.2 %
HCT: 39.7 % (ref 35.0–45.0)
Hemoglobin: 12.8 g/dL (ref 11.7–15.5)
Lymphs Abs: 3171 {cells}/uL (ref 850–3900)
MCH: 28.1 pg (ref 27.0–33.0)
MCHC: 32.2 g/dL (ref 32.0–36.0)
MCV: 87.3 fL (ref 80.0–100.0)
MPV: 10 fL (ref 7.5–12.5)
Monocytes Relative: 6.6 %
Neutro Abs: 4465 {cells}/uL (ref 1500–7800)
Neutrophils Relative %: 53.8 %
Platelets: 283 10*3/uL (ref 140–400)
RBC: 4.55 10*6/uL (ref 3.80–5.10)
RDW: 13 % (ref 11.0–15.0)
Total Lymphocyte: 38.2 %
WBC: 8.3 10*3/uL (ref 3.8–10.8)

## 2023-04-23 LAB — TSH: TSH: 1.96 m[IU]/L

## 2023-04-23 LAB — LIPID PANEL
Cholesterol: 221 mg/dL — ABNORMAL HIGH (ref <200)
HDL: 47 mg/dL — ABNORMAL LOW (ref >=50)
LDL Cholesterol (Calc): 148 mg/dL — ABNORMAL HIGH
Non-HDL Cholesterol (Calc): 174 mg/dL (calc) — ABNORMAL HIGH (ref <130)
Total CHOL/HDL Ratio: 4.7 (calc) (ref <5.0)
Triglycerides: 132 mg/dL (ref <150)

## 2023-04-30 DIAGNOSIS — D49511 Neoplasm of unspecified behavior of right kidney: Secondary | ICD-10-CM | POA: Diagnosis not present

## 2023-05-12 ENCOUNTER — Telehealth: Payer: Self-pay

## 2023-05-12 NOTE — Telephone Encounter (Signed)
Pt called in to ask for a PA for this med please:  calcipotriene-betamethasone (TACLONEX) ointment [[id number]]  Cb#: [id number]

## 2023-05-13 MED ORDER — CALCIPOTRIENE-BETAMETH DIPROP 0.005-0.064 % EX OINT: 1.0000 | TOPICAL_OINTMENT | CUTANEOUS | 0 refills | Status: DC

## 2023-06-30 DIAGNOSIS — Z1211 Encounter for screening for malignant neoplasm of colon: Secondary | ICD-10-CM | POA: Diagnosis not present

## 2023-07-08 LAB — COLOGUARD: COLOGUARD: NEGATIVE

## 2023-09-30 ENCOUNTER — Other Ambulatory Visit: Payer: Self-pay | Admitting: Family Medicine

## 2023-12-15 ENCOUNTER — Other Ambulatory Visit: Payer: Self-pay | Admitting: Family Medicine

## 2023-12-16 ENCOUNTER — Other Ambulatory Visit: Payer: Self-pay

## 2023-12-16 ENCOUNTER — Telehealth: Payer: Self-pay

## 2023-12-16 ENCOUNTER — Other Ambulatory Visit: Payer: Self-pay | Admitting: Family Medicine

## 2023-12-16 DIAGNOSIS — L409 Psoriasis, unspecified: Secondary | ICD-10-CM

## 2023-12-16 MED ORDER — CALCIPOTRIENE-BETAMETH DIPROP 0.005-0.064 % EX OINT: 1.0000 | TOPICAL_OINTMENT | CUTANEOUS | 0 refills | Status: AC

## 2023-12-16 MED ORDER — CLOBETASOL PROPIONATE 0.05 % EX SHAM: 1.0000 | MEDICATED_SHAMPOO | CUTANEOUS | 1 refills | Status: DC

## 2023-12-16 MED ORDER — FLUOCINOLONE ACETONIDE BODY 0.01 % EX OIL: TOPICAL_OIL | CUTANEOUS | 1 refills | Status: DC

## 2023-12-16 NOTE — Telephone Encounter (Signed)
 Copied from CRM [id number]. Topic: Clinical - Medication Refill >> Dec 16, 2023  2:53 PM Zina Hilts wrote: Most Recent Primary Care Visit:  Provider: [facility]  Department: [facility]  Visit Type: LAB  Date: 04/22/2023  Medication: calcipotriene-betamethasone (TACLONEX) ointment Clobetasol Propionate 0.05 % shampoo DERMA-SMOOTHE/FS BODY 0.01 % OIL  dermatologist appt not available until  June so patient needs meds until she can see the dermatologist   Has the patient contacted their pharmacy? Yes (Agent: If no, request that the patient contact the pharmacy for the refill. If patient does not wish to contact the pharmacy document the reason why and proceed with request.) (Agent: If yes, when and what did the pharmacy advise?) contact pcp  Is this the correct pharmacy for this prescription? yes If no, delete pharmacy and type the correct one.  This is the patient's preferred pharmacy:  [id number] Jonette Nestle, [place] - [address] [place] [address] Phone: [phone] Fax: [phone]   Has the prescription been filled recently? No  Is the patient out of the medication? Yes  Has the patient been seen for an appointment in the last year OR does the patient have an upcoming appointment? Yes  Can we respond through MyChart? No  Agent: Please be advised that Rx refills may take up to 3 business days. We ask that you follow-up with your pharmacy.

## 2024-02-25 ENCOUNTER — Ambulatory Visit (INDEPENDENT_AMBULATORY_CARE_PROVIDER_SITE_OTHER): Admitting: Physician Assistant

## 2024-02-25 ENCOUNTER — Encounter: Payer: Self-pay | Admitting: Physician Assistant

## 2024-02-25 VITALS — BP 111/73

## 2024-02-25 DIAGNOSIS — D239 Other benign neoplasm of skin, unspecified: Secondary | ICD-10-CM

## 2024-02-25 DIAGNOSIS — D229 Melanocytic nevi, unspecified: Secondary | ICD-10-CM | POA: Diagnosis not present

## 2024-02-25 DIAGNOSIS — L821 Other seborrheic keratosis: Secondary | ICD-10-CM | POA: Diagnosis not present

## 2024-02-25 DIAGNOSIS — D2362 Other benign neoplasm of skin of left upper limb, including shoulder: Secondary | ICD-10-CM

## 2024-02-25 DIAGNOSIS — L409 Psoriasis, unspecified: Secondary | ICD-10-CM

## 2024-02-25 DIAGNOSIS — D2372 Other benign neoplasm of skin of left lower limb, including hip: Secondary | ICD-10-CM

## 2024-02-25 MED ORDER — CLOBETASOL PROPIONATE 0.05 % EX OINT: 1.0000 | TOPICAL_OINTMENT | Freq: Two times a day (BID) | CUTANEOUS | 3 refills | Status: AC

## 2024-02-25 MED ORDER — CLOBETASOL PROPIONATE 0.05 % EX SHAM: 1.0000 | MEDICATED_SHAMPOO | CUTANEOUS | 1 refills | Status: AC

## 2024-02-25 MED ORDER — FLUOCINOLONE ACETONIDE BODY 0.01 % EX OIL: TOPICAL_OIL | CUTANEOUS | 1 refills | Status: AC

## 2024-02-25 MED ORDER — CLOBETASOL PROPIONATE 0.05 % EX SOLN: 1.0000 | Freq: Two times a day (BID) | CUTANEOUS | 3 refills | Status: AC

## 2024-02-25 MED ORDER — CALCIPOTRIENE 0.005 % EX CREA: TOPICAL_CREAM | Freq: Two times a day (BID) | CUTANEOUS | 2 refills | Status: AC

## 2024-02-25 NOTE — Patient Instructions (Signed)

## 2024-02-25 NOTE — Progress Notes (Signed)
 New Patient Visit   Subjective  Kelly Moreno is a 48 y.o. female who presents for the following: Psoriasis of body and scalp - she was diagnosed in the early 2000s. Clobetasol  shampoo once a week, DermaSmoothe oil weekly, Calcipotriene  cream 2-3 times per week. Medications keep her controlled but she will flare if she misses. She would like to discuss systemic treatment.  About 5 years ago she had a tumor on her kidney that was ablated and she would like to make sure a systemic medication would not affected that since she has been clear since the ablation. No pathology was performed - they treated it like it was a cancer. She has noticed some pain in her shoulder but she has been working out more lately.   She also has a new mole on her left cheek that she would like checked and an area on her left upper arm.    The following portions of the chart were reviewed this encounter and updated as appropriate: medications, allergies, medical history  Review of Systems:  No other skin or systemic complaints except as noted in HPI or Assessment and Plan.  Objective  Well appearing patient in no apparent distress; mood and affect are within normal limits.   A focused examination was performed of the following areas:  Full skin exam.    Relevant exam findings are noted in the Assessment and Plan.    Assessment & Plan    MELANOCYTIC NEVI - Tan-brown and/or pink-flesh-colored symmetric macules and papules - Benign appearing on exam today - Observation - Call clinic for new or changing moles - Recommend daily use of broad spectrum spf 30+ sunscreen to sun-exposed areas.    PSORIASIS - INADEQUATELY CONTROLLED.  Exam: Well-demarcated erythematous papules/plaques with silvery scale, guttate pink scaly papules. 25% BSA.   patient denies joint pain.  Psoriasis is a chronic non-curable, but treatable genetic/hereditary disease that may have other systemic features affecting other  organ systems such as joints (Psoriatic Arthritis). It is associated with an increased risk of inflammatory bowel disease, heart disease, non-alcoholic fatty liver disease, and depression.  Treatments include light and laser treatments; topical medications; and systemic medications including oral and injectables.  Treatment Plan: Lengthy discussion regarding treatment with biologic vs Otezla vs staying on topical treatment.  Continue clobetasol  shampoo, calcipotriene  and Derma-smooth as directed. New Rx to add to her regime include: clobetasol  solution and clobetasol  ointment as directed.   Pt is not a candidate for methotrexate or cyclosporine due to inability for follow up labs and contraindications with other medications.  Pt has no access to a light box for phototherapy.  Due to the progressive and chronic nature of her psoriasis, patient has tried and failed numerous topicals creams as noted above, the next best therapeutic option is a systemic therapy. It is medically necessary to help improve her quality of life. We will plan to start Skyrizi injections pending labs and insurance approval.    DERMATOFIBROMA Exam: Firm pink/brown papulenodule with dimple sign of left thigh and left upper arm.   Treatment Plan: A dermatofibroma is a benign growth possibly related to trauma, such as an insect bite, cut from shaving, or inflamed acne-type bump.  Treatment options to remove include shave or excision with resulting scar and risk of recurrence.  Since benign-appearing and not bothersome, will observe for now.   Dermatosis papulosis nigra (face)  - Reassurance benign.   Time Review:   I spent a total of 60 minutes  reviewing the patient's chart, including:   15 minutes reviewing prior records, labs, imaging, and other pertinent information before the visit.   30 minutes discussing the patient's case and providing care during the visit.   15 minutes documenting and reviewing post-visit  findings and care plan after the visit.    PSORIASIS   Related Procedures Comprehensive metabolic panel CBC with Differential/Platelet Hepatitis B surface antibody,qualitative Hepatitis B surface antigen Hepatitis C antibody QuantiFERON-TB Gold Plus Related Medications calcipotriene -betamethasone (TACLONEX) ointment Apply 1 Application topically once a week. Applied to affected area(s) of skin Clobetasol  Propionate 0.05 % shampoo Apply 1 application  topically once a week. Fluocinolone  Acetonide Body (DERMA-SMOOTHE /FS BODY) 0.01 % OIL APPLY 1 APPLICATION TOPICALLY ONCE A WEEK APPLIED TO SCALP DERMATOSIS PAPULOSA NIGRA   DERMATOFIBROMA    Return in about 6 months (around 08/26/2024) for Psoriasis.  I, Roseline Hutchinson, CMA, am acting as scribe for Amya Hlad K, PA-C .   Documentation: I have reviewed the above documentation for accuracy and completeness, and I agree with the above.  Clennon Nasca K, PA-C

## 2024-02-29 LAB — HEPATITIS C ANTIBODY: Hep C Virus Ab: NONREACTIVE

## 2024-02-29 LAB — CBC WITH DIFFERENTIAL/PLATELET
Basophils Absolute: 0 10*3/uL (ref 0.0–0.2)
Basos: 0 %
EOS (ABSOLUTE): 0.1 10*3/uL (ref 0.0–0.4)
Eos: 1 %
Hematocrit: 43 % (ref 34.0–46.6)
Hemoglobin: 13.5 g/dL (ref 11.1–15.9)
Immature Grans (Abs): 0 10*3/uL (ref 0.0–0.1)
Immature Granulocytes: 0 %
Lymphocytes Absolute: 2.8 10*3/uL (ref 0.7–3.1)
Lymphs: 39 %
MCH: 28.5 pg (ref 26.6–33.0)
MCHC: 31.4 g/dL — ABNORMAL LOW (ref 31.5–35.7)
MCV: 91 fL (ref 79–97)
Monocytes Absolute: 0.6 10*3/uL (ref 0.1–0.9)
Monocytes: 8 %
Neutrophils Absolute: 3.7 10*3/uL (ref 1.4–7.0)
Neutrophils: 52 %
Platelets: 283 10*3/uL (ref 150–450)
RBC: 4.74 x10E6/uL (ref 3.77–5.28)
RDW: 13.1 % (ref 11.7–15.4)
WBC: 7.2 10*3/uL (ref 3.4–10.8)

## 2024-02-29 LAB — COMPREHENSIVE METABOLIC PANEL WITH GFR
ALT: 19 IU/L (ref 0–32)
AST: 19 IU/L (ref 0–40)
Albumin: 4.6 g/dL (ref 3.9–4.9)
Alkaline Phosphatase: 91 IU/L (ref 44–121)
BUN/Creatinine Ratio: 14 (ref 9–23)
BUN: 13 mg/dL (ref 6–24)
Bilirubin Total: 0.3 mg/dL (ref 0.0–1.2)
CO2: 23 mmol/L (ref 20–29)
Calcium: 9.7 mg/dL (ref 8.7–10.2)
Chloride: 99 mmol/L (ref 96–106)
Creatinine, Ser: 0.93 mg/dL (ref 0.57–1.00)
Globulin, Total: 3.6 g/dL (ref 1.5–4.5)
Glucose: 82 mg/dL (ref 70–99)
Potassium: 4.7 mmol/L (ref 3.5–5.2)
Sodium: 138 mmol/L (ref 134–144)
Total Protein: 8.2 g/dL (ref 6.0–8.5)
eGFR: 76 mL/min/{1.73_m2} (ref >59)

## 2024-02-29 LAB — QUANTIFERON-TB GOLD PLUS
QuantiFERON Mitogen Value: >10 [IU]/mL
QuantiFERON Nil Value: 0.06 [IU]/mL
QuantiFERON TB1 Ag Value: 0.07 [IU]/mL
QuantiFERON TB2 Ag Value: 0.07 [IU]/mL
QuantiFERON-TB Gold Plus: NEGATIVE

## 2024-02-29 LAB — HEPATITIS B SURFACE ANTIBODY,QUALITATIVE: Hep B Surface Ab, Qual: NONREACTIVE

## 2024-02-29 LAB — HEPATITIS B SURFACE ANTIGEN: Hepatitis B Surface Ag: NEGATIVE

## 2024-03-02 ENCOUNTER — Ambulatory Visit: Payer: Self-pay | Admitting: Physician Assistant

## 2024-03-02 NOTE — Telephone Encounter (Signed)
-----   Message from [facility] K sent at 03/02/2024  9:10 AM EDT ----- Labs WNL -- okay to start process of insurance approval of Skyrizi.  ----- Message ----- From: Interface, [facility] Lab Results In Sent: 02/29/2024   1:35 PM EDT To: Erminio MARLA Like, PA-C

## 2024-03-02 NOTE — Telephone Encounter (Signed)
 Advised patient of labs results and we will initiate the insurance authorization for Skyrizi/hd

## 2024-03-09 ENCOUNTER — Other Ambulatory Visit: Payer: Self-pay

## 2024-03-10 ENCOUNTER — Other Ambulatory Visit: Payer: Self-pay

## 2024-03-10 MED ORDER — SKYRIZI PEN 150 MG/ML ~~LOC~~ SOAJ: 150.0000 mg | SUBCUTANEOUS | 3 refills | Status: AC

## 2024-03-10 MED ORDER — SKYRIZI PEN 150 MG/ML ~~LOC~~ SOAJ: 150.0000 mg | SUBCUTANEOUS | 1 refills | Status: AC

## 2024-04-08 DIAGNOSIS — D49511 Neoplasm of unspecified behavior of right kidney: Secondary | ICD-10-CM | POA: Diagnosis not present

## 2024-04-22 ENCOUNTER — Encounter (HOSPITAL_COMMUNITY): Payer: Self-pay

## 2024-04-22 ENCOUNTER — Emergency Department (HOSPITAL_COMMUNITY)
Admission: EM | Admit: 2024-04-22 | Discharge: 2024-04-22 | Disposition: A | Discharge status: Home / Self Care | Attending: Emergency Medicine | Admitting: Emergency Medicine

## 2024-04-22 ENCOUNTER — Other Ambulatory Visit: Payer: Self-pay

## 2024-04-22 ENCOUNTER — Ambulatory Visit: Payer: Self-pay

## 2024-04-22 ENCOUNTER — Emergency Department (HOSPITAL_COMMUNITY)

## 2024-04-22 DIAGNOSIS — Z0389 Encounter for observation for other suspected diseases and conditions ruled out: Secondary | ICD-10-CM | POA: Diagnosis not present

## 2024-04-22 DIAGNOSIS — R9389 Abnormal findings on diagnostic imaging of other specified body structures: Secondary | ICD-10-CM | POA: Insufficient documentation

## 2024-04-22 DIAGNOSIS — Z049 Encounter for examination and observation for unspecified reason: Secondary | ICD-10-CM

## 2024-04-22 DIAGNOSIS — C641 Malignant neoplasm of right kidney, except renal pelvis: Secondary | ICD-10-CM | POA: Diagnosis not present

## 2024-04-22 DIAGNOSIS — D49511 Neoplasm of unspecified behavior of right kidney: Secondary | ICD-10-CM | POA: Diagnosis not present

## 2024-04-22 HISTORY — DX: Malignant (primary) neoplasm, unspecified: C80.1

## 2024-04-22 LAB — COMPREHENSIVE METABOLIC PANEL WITH GFR
ALT: 26 U/L (ref 0–44)
AST: 22 U/L (ref 15–41)
Albumin: 4.1 g/dL (ref 3.5–5.0)
Alkaline Phosphatase: 81 U/L (ref 38–126)
Anion gap: 10 (ref 5–15)
BUN: 21 mg/dL — ABNORMAL HIGH (ref 6–20)
CO2: 24 mmol/L (ref 22–32)
Calcium: 9.3 mg/dL (ref 8.9–10.3)
Chloride: 101 mmol/L (ref 98–111)
Creatinine, Ser: 0.9 mg/dL (ref 0.44–1.00)
GFR, Estimated: >60 mL/min (ref >60)
Glucose, Bld: 92 mg/dL (ref 70–99)
Potassium: 3.6 mmol/L (ref 3.5–5.1)
Sodium: 135 mmol/L (ref 135–145)
Total Bilirubin: 0.5 mg/dL (ref 0.0–1.2)
Total Protein: 8.8 g/dL — ABNORMAL HIGH (ref 6.5–8.1)

## 2024-04-22 LAB — CBC WITH DIFFERENTIAL/PLATELET
Abs Immature Granulocytes: 0.03 K/uL (ref 0.00–0.07)
Basophils Absolute: 0 K/uL (ref 0.0–0.1)
Basophils Relative: 0 %
Eosinophils Absolute: 0.1 K/uL (ref 0.0–0.5)
Eosinophils Relative: 1 %
HCT: 40 % (ref 36.0–46.0)
Hemoglobin: 12.5 g/dL (ref 12.0–15.0)
Immature Granulocytes: 0 %
Lymphocytes Relative: 35 %
Lymphs Abs: 3.1 K/uL (ref 0.7–4.0)
MCH: 27.7 pg (ref 26.0–34.0)
MCHC: 31.3 g/dL (ref 30.0–36.0)
MCV: 88.5 fL (ref 80.0–100.0)
Monocytes Absolute: 0.7 K/uL (ref 0.1–1.0)
Monocytes Relative: 8 %
Neutro Abs: 5 K/uL (ref 1.7–7.7)
Neutrophils Relative %: 56 %
Platelets: 262 K/uL (ref 150–400)
RBC: 4.52 MIL/uL (ref 3.87–5.11)
RDW: 15.1 % (ref 11.5–15.5)
WBC: 9 K/uL (ref 4.0–10.5)
nRBC: 0 % (ref 0.0–0.2)

## 2024-04-22 LAB — HCG, SERUM, QUALITATIVE: Preg, Serum: NEGATIVE

## 2024-04-22 LAB — TROPONIN I (HIGH SENSITIVITY): Troponin I (High Sensitivity): <2 ng/L (ref <18)

## 2024-04-22 MED ORDER — IOHEXOL 350 MG/ML SOLN
75.0000 mL | Freq: Once | INTRAVENOUS | Status: AC | PRN
  Administered 2024-04-22: 75 mL via INTRAVENOUS

## 2024-04-22 NOTE — ED Triage Notes (Addendum)
 Patient said she has a history of kidney cancer 3 years ago. Had a CT scan today, was called to come to ED. Was told she has a pulmonary embolism in her lungs. No history of blood clots. No shortness of breath or chest pain. Had a 2 hour flight 2 weeks ago.

## 2024-04-22 NOTE — ED Provider Triage Note (Signed)
 Emergency Medicine Provider Triage Evaluation Note  Kelly Moreno , a 49 y.o. female  was evaluated in triage.  Pt complains of abnormal CT. Reports she had a CT scan with urology today for her regular annual cancer screening due to hx of renal cell carcinoma in remission. They called her and told her that she was cancer free but had a PE incidentally seen on imaging. Denies any symptoms, specifically no chest pain or shortness of breath.  No leg pain or leg swelling.  No history of blood clots.  She is not on anticoagulation.  No OCPs.  Does report that she had recent 2 hour flight to [place] 2 weeks ago  Review of Systems  Positive:  Negative:   Physical Exam  BP 112/83   Pulse 92   Temp 98.3 F (36.8 C) (Oral)   Resp 19   Ht 5' 2 (1.575 m)   Wt 81.6 kg   SpO2 100%   BMI 32.92 kg/m  Gen:   Awake, no distress   Resp:  Normal effort  MSK:   Moves extremities without difficulty  Other:  No leg pain or leg swelling  Medical Decision Making  Medically screening exam initiated at 2:05 PM.  Appropriate orders placed.  Kelly Moreno was informed that the remainder of the evaluation will be completed by another provider, this initial triage assessment does not replace that evaluation, and the importance of remaining in the ED until their evaluation is complete.    Nora Lauraine LABOR, PA-C 04/22/24 1408

## 2024-04-22 NOTE — Discharge Instructions (Signed)
 As we discussed, your CT was normal.  There was no signs of a blood clot in your lungs today.  You are also asymptomatic, no further evaluation is indicated.  Follow-up with your outpatient specialist as needed.  Return if development of any new or worsening symptoms.

## 2024-04-22 NOTE — Telephone Encounter (Signed)
 Pt called due to having a CT done by urology and they stated she has a blood clot in her lung and instructed her to go to ED or her PCP. Pt denies any symptoms, NT instructed pt to go the the ED.

## 2024-04-22 NOTE — ED Provider Notes (Signed)
  EMERGENCY DEPARTMENT AT [facility] Provider Note   CSN: [id number] Arrival date & time: 04/22/24  1341     Patient presents with: Abnormal CT   Kelly Moreno is a 48 y.o. female.   Patient with history of renal cell carcinoma in remission presents today with concerns for abnormal CT.  She reports that she had a routine CT scan with urology today for her regular annual cancer screening due to hx of renal cell carcinoma in remission. They called her and told her that she was cancer free but had a PE incidentally seen on imaging. Denies any symptoms, specifically no chest pain or shortness of breath.  No leg pain or leg swelling.  No history of blood clots.  She is not on anticoagulation.  No OCPs.  Does report that she had recent 2 hour flight to [place] 2 weeks ago.   The history is provided by the patient. No language interpreter was used.       Prior to Admission medications   Medication Sig Start Date End Date Taking? Authorizing Provider  Acetaminophen -Caff-Pyrilamine (MIDOL COMPLETE) 500-60-15 MG TABS Take 1 tablet by mouth 3 (three) times daily as needed (menstrual pain.).    [provider]  amoxicillin -clavulanate (AUGMENTIN ) 875-125 MG tablet Take 1 tablet by mouth 2 (two) times daily. 04/21/23   Kayla Jeoffrey RAMAN, FNP  calcipotriene  (DOVONOX) 0.005 % cream Apply topically 2 (two) times daily. Apply to affected areas twice daily x 2 weeks. Alternate with Calcipotriene  cream every 2 weeks. 02/25/24   Sandridge, Brenda K, PA-C  calcipotriene -betamethasone (TACLONEX) ointment Apply 1 Application topically once a week. Applied to affected area(s) of skin 12/16/23   Kayla Jeoffrey RAMAN, FNP  clobetasol  (TEMOVATE ) 0.05 % external solution Apply 1 Application topically 2 (two) times daily. Apply to scalp as needed for itch 02/25/24   Sandridge, Brenda K, PA-C  clobetasol  ointment (TEMOVATE ) 0.05 % Apply 1 Application topically 2 (two) times daily. Apply to  affected areas twice daily x 2 weeks. Alternate with Calcipotriene  cream every 2 weeks. 02/25/24   Sandridge, Brenda K, PA-C  Clobetasol  Propionate 0.05 % shampoo Apply 1 application  topically once a week. 02/25/24   Sandridge, Brenda K, PA-C  docusate sodium  (COLACE) 100 MG capsule Take 1 capsule (100 mg total) by mouth 2 (two) times daily as needed for mild constipation. 08/16/21   Han, Aimee H, PA-C  Fluocinolone  Acetonide Body (DERMA-SMOOTHE /FS BODY) 0.01 % OIL APPLY 1 APPLICATION TOPICALLY ONCE A WEEK APPLIED TO SCALP 02/25/24   Sandridge, Brenda K, PA-C  fluticasone  (FLONASE ) 50 MCG/ACT nasal spray Place 1 spray into both nostrils every evening.    [provider]  ibuprofen (ADVIL) 200 MG tablet Take 200 mg by mouth every 8 (eight) hours as needed (menstrual pain.).    [provider]  JUNEL 1/20 1-20 MG-MCG tablet Take 1 tablet by mouth at bedtime. Patient not taking: Reported on 04/21/2023 06/17/21   [provider]  levocetirizine (XYZAL ) 5 MG tablet Take 5 mg by mouth every evening.    [provider]  Multiple Vitamin (MULTIVITAMIN WITH MINERALS) TABS tablet Take 1 tablet by mouth every evening.    [provider]  Multiple Vitamin (MULTIVITAMIN) LIQD Take 15 mLs by mouth 2 (two) times a week. NutraBurst Liquid Multivitamin    [provider]  norgestimate -ethinyl estradiol  (ORTHO-CYCLEN) 0.25-35 MG-MCG tablet Take 1 tablet by mouth at bedtime. Patient not taking: Reported on 04/21/2023    [provider]  Pseudoephedrine-Ibuprofen (ADVIL COLD/SINUS) 30-200 MG TABS Take 1 tablet by mouth daily as needed (cold symptoms).    [provider]  risankizumab -rzaa (SKYRIZI  PEN) 150 MG/ML pen Inject 1 mL (150 mg total) into the skin as directed. At weeks 0 & 4. 03/10/24   Orman Erminio POUR, PA-C  risankizumab -rzaa (SKYRIZI  PEN) 150 MG/ML pen Inject 1 mL (150 mg total) into the skin as directed. Every 12 weeks for maintenance.  03/10/24   Sandridge, Brenda K, PA-C  valACYclovir  (VALTREX ) 500 MG tablet Take 500 mg by mouth 2 (two) times daily as needed (fever blisters/cold sores). 06/17/21   [provider]    Allergies: Patient has no known allergies.    Review of Systems  All other systems reviewed and are negative.   Updated Vital Signs BP (!) 114/99 (BP Location: Left Arm)   Pulse 73   Temp 98.1 F (36.7 C) (Oral)   Resp 16   Ht 5' 2 (1.575 m)   Wt 81.6 kg   SpO2 98%   BMI 32.92 kg/m   Physical Exam Vitals and nursing note reviewed.  Constitutional:      General: She is not in acute distress.    Appearance: Normal appearance. She is normal weight. She is not ill-appearing, toxic-appearing or diaphoretic.  HENT:     Head: Normocephalic and atraumatic.  Cardiovascular:     Rate and Rhythm: Normal rate and regular rhythm.     Heart sounds: Normal heart sounds.  Pulmonary:     Effort: Pulmonary effort is normal. No respiratory distress.     Breath sounds: Normal breath sounds.  Abdominal:     General: Abdomen is flat.     Palpations: Abdomen is soft.     Tenderness: There is no abdominal tenderness.  Musculoskeletal:        General: No tenderness. Normal range of motion.     Cervical back: Normal range of motion.     Right lower leg: No edema.     Left lower leg: No edema.  Skin:    General: Skin is warm and dry.  Neurological:     General: No focal deficit present.     Mental Status: She is alert.  Psychiatric:        Mood and Affect: Mood normal.        Behavior: Behavior normal.     (all labs ordered are listed, but only abnormal results are displayed) Labs Reviewed  COMPREHENSIVE METABOLIC PANEL WITH GFR - Abnormal; Notable for the following components:      Result Value   BUN 21 (*)    Total Protein 8.8 (*)    All other components within normal limits  CBC WITH DIFFERENTIAL/PLATELET  HCG, SERUM, QUALITATIVE  TROPONIN I (HIGH SENSITIVITY)     EKG: None  Radiology: CT Angio Chest PE W and/or Wo Contrast Result Date: 04/22/2024 CLINICAL DATA:  Concern for pulmonary emboli. EXAM: CT ANGIOGRAPHY CHEST WITH CONTRAST TECHNIQUE: Multidetector CT imaging of the chest was performed using the standard protocol during bolus administration of intravenous contrast. Multiplanar CT image reconstructions and MIPs were obtained to evaluate the vascular anatomy. RADIATION DOSE REDUCTION: This exam was performed according to the departmental dose-optimization program which includes automated exposure control, adjustment of the mA and/or kV according to patient size and/or use of iterative reconstruction technique. CONTRAST:  75mL OMNIPAQUE  IOHEXOL  350 MG/ML SOLN COMPARISON:  None Available. FINDINGS: Cardiovascular: There is no cardiomegaly or pericardial effusion. The thoracic aorta  is unremarkable. No pulmonary artery embolus identified. Mediastinum/Nodes: No hilar or mediastinal adenopathy. The esophagus is grossly unremarkable. No mediastinal fluid collection. Lungs/Pleura: The lungs are clear. There is no pleural effusion or pneumothorax. The central airways are patent. Upper Abdomen: No acute abnormality. Musculoskeletal: No acute osseous pathology. Review of the MIP images confirms the above findings. IMPRESSION: No acute intrathoracic pathology. No CT evidence of pulmonary artery embolus. Electronically Signed   By: Vanetta Chou M.D.   On: 04/22/2024 17:09     Procedures   Medications Ordered in the ED  iohexol  (OMNIPAQUE ) 350 MG/ML injection 75 mL (75 mLs Intravenous Contrast Given 04/22/24 1639)                                    Medical Decision Making Amount and/or Complexity of Data Reviewed Labs: ordered. Radiology: ordered.  Risk Prescription drug management.   This patient is a 48 y.o. female who presents to the ED for concern of abnormal CT  Past Medical History / Co-morbidities / Social History:  has a past medical  history of Anemia, Cancer (HCC), and Seasonal allergies.  Additional history: Chart reviewed. Pertinent results include: unable to see CT imaging from urology  Physical Exam: Physical exam performed. The pertinent findings include: well appearing, no acute physical exam abnormalities  Lab Tests: I ordered, and personally interpreted labs.  The pertinent results include: No acute laboratory abnormalities.   Imaging Studies: I ordered imaging studies including CTA PE. I independently visualized and interpreted imaging which showed   No acute intrathoracic pathology. No CT evidence of pulmonary artery embolus.  I agree with the radiologist interpretation.   Disposition: After consideration of the diagnostic results and the patients response to treatment, I feel that emergency department workup does not suggest an emergent condition requiring admission or immediate intervention beyond what has been performed at this time. The plan is: Discharge.  Her CT is normal, her exam is benign and she feels completely asymptomatic. Therefore no further evaluation is indicated.  Discussed and the patient is understanding and agreement. Evaluation and diagnostic testing in the emergency department does not suggest an emergent condition requiring admission or immediate intervention beyond what has been performed at this time.  Plan for discharge with close PCP follow-up.  Patient is understanding and amenable with plan, educated on red flag symptoms that would prompt immediate return.  Patient discharged in stable condition.   I discussed this case with my attending physician Dr. Franklyn who cosigned this note including patient's presenting symptoms, physical exam, and planned diagnostics and interventions. Attending physician stated agreement with plan or made changes to plan which were implemented.    Final diagnoses:  Suspected condition not found    ED Discharge Orders     None     An After Visit  Summary was printed and given to the patient.      Nora Lauraine LABOR, PA-C 04/22/24 1736    Franklyn Sid SAILOR, MD 04/24/24 [phone]

## 2024-04-22 NOTE — ED Notes (Signed)
 US  PIV placed.

## 2024-04-22 NOTE — Telephone Encounter (Signed)
 FYI Only or Action Required?: FYI only for provider.  Patient was last seen in primary care on 04/21/2023 by Kayla Jeoffrey RAMAN, FNP.  Called Nurse Triage reporting Advice Only.  Symptoms began today.  Interventions attempted: Nothing.  Symptoms are: unchanged.  Triage Disposition: Go to ED Now (overriding Information or Advice Only Call)  Patient/caregiver understands and will follow disposition?: Yes         Copied from CRM #[id number]. Topic: Clinical - Red Word Triage >> Apr 22, 2024  1:23 PM Teressa P wrote: Red Word that prompted transfer to Nurse Triage: pt went to [facility] and they did a CT  it showed a Pulmonary Blood Clot.  She was told to go to primary or the hospital and there are no appts today Reason for Disposition  General information question, no triage required and triager able to answer question  Answer Assessment - Initial Assessment Questions 1. REASON FOR CALL: What is the main reason for your call? or How can I best help you?     Pt was told by her other providers office that she has a blood clot in the lungs, and was instructed to either go to the ED or call her PCP.   2. SYMPTOMS : Do you have any symptoms?      Pt denies SOB or chest pain.  Protocols used: Information Only Call - No Triage-A-AH

## 2024-04-28 ENCOUNTER — Ambulatory Visit: Admitting: Physician Assistant

## 2024-04-28 DIAGNOSIS — L409 Psoriasis, unspecified: Secondary | ICD-10-CM | POA: Diagnosis not present

## 2024-04-28 MED ORDER — RISANKIZUMAB-RZAA 150 MG/ML ~~LOC~~ SOAJ
150.0000 mg | Freq: Once | SUBCUTANEOUS | Status: AC
  Administered 2024-04-28: 150 mg via SUBCUTANEOUS

## 2024-04-28 NOTE — Progress Notes (Signed)
   Follow-Up Visit   Subjective  Kelly Moreno is a 48 y.o. female who presents for the following: Psoriasis. Skyrizi  was approved by her insurance company and she is here for her initial (and first) injection with training.     The following portions of the chart were reviewed this encounter and updated as appropriate: medications, allergies, medical history  Review of Systems:  No other skin or systemic complaints except as noted in HPI or Assessment and Plan.  Objective  Well appearing patient in no apparent distress; mood and affect are within normal limits.    Relevant exam findings are noted in the Assessment and Plan.      Assessment & Plan   PSORIASIS   Related Medications calcipotriene -betamethasone (TACLONEX) ointment Apply 1 Application topically once a week. Applied to affected area(s) of skin Clobetasol  Propionate 0.05 % shampoo Apply 1 application  topically once a week. Fluocinolone  Acetonide Body (DERMA-SMOOTHE /FS BODY) 0.01 % OIL APPLY 1 APPLICATION TOPICALLY ONCE A WEEK APPLIED TO SCALP risankizumab -rzaa (SKYRIZI ) pen 150 mg   PSORIASIS Well-demarcated erythematous papules/plaques with silvery scale, guttate pink scaly papules. 25% BSA.    Treatment Plan: Patient was trained using the trainer pen to inject Skyrizi  today.  Patient injected Skyrizi  150mg /mL to her left thigh. Lot # [id number] Exp 06/02/2025. Patient tolerated the injection well.   Patient has her second loading dose injection and she will plan to do that on a video visit with her [employer] Ambassador in 4 weeks.   Return for Follow up as scheduled.  I, Roseline Hutchinson, CMA, am acting as scribe for Danzell Birky K, PA-C .   Documentation: I have reviewed the above documentation for accuracy and completeness, and I agree with the above.  Nuh Lipton K, PA-C

## 2024-04-28 NOTE — Patient Instructions (Signed)

## 2024-04-29 DIAGNOSIS — D49511 Neoplasm of unspecified behavior of right kidney: Secondary | ICD-10-CM | POA: Diagnosis not present

## 2024-05-19 DIAGNOSIS — Z0120 Encounter for dental examination and cleaning without abnormal findings: Secondary | ICD-10-CM | POA: Diagnosis not present

## 2024-07-26 DIAGNOSIS — H5213 Myopia, bilateral: Secondary | ICD-10-CM | POA: Diagnosis not present

## 2024-08-17 ENCOUNTER — Ambulatory Visit: Admitting: Physician Assistant

## 2024-12-07 ENCOUNTER — Ambulatory Visit: Admitting: Physician Assistant
